# Patient Record
Sex: Female | Born: 1980
Health system: Southern US, Community
[De-identification: ages and names within clinical notes are randomized; demographics above are authoritative.]

## PROBLEM LIST (undated history)

## (undated) DIAGNOSIS — R079 Chest pain, unspecified: Secondary | ICD-10-CM

## (undated) DIAGNOSIS — N920 Excessive and frequent menstruation with regular cycle: Secondary | ICD-10-CM

## (undated) DIAGNOSIS — R5383 Other fatigue: Secondary | ICD-10-CM

## (undated) DIAGNOSIS — M545 Low back pain, unspecified: Secondary | ICD-10-CM

## (undated) DIAGNOSIS — E0590 Thyrotoxicosis, unspecified without thyrotoxic crisis or storm: Secondary | ICD-10-CM

## (undated) DIAGNOSIS — N644 Mastodynia: Secondary | ICD-10-CM

## (undated) DIAGNOSIS — R635 Abnormal weight gain: Secondary | ICD-10-CM

## (undated) DIAGNOSIS — G4733 Obstructive sleep apnea (adult) (pediatric): Secondary | ICD-10-CM

## (undated) DIAGNOSIS — B977 Papillomavirus as the cause of diseases classified elsewhere: Secondary | ICD-10-CM

## (undated) DIAGNOSIS — G479 Sleep disorder, unspecified: Secondary | ICD-10-CM

## (undated) DIAGNOSIS — T7840XA Allergy, unspecified, initial encounter: Secondary | ICD-10-CM

## (undated) DIAGNOSIS — R5381 Other malaise: Secondary | ICD-10-CM

## (undated) DIAGNOSIS — J329 Chronic sinusitis, unspecified: Secondary | ICD-10-CM

## (undated) DIAGNOSIS — E039 Hypothyroidism, unspecified: Secondary | ICD-10-CM

## (undated) DIAGNOSIS — R0789 Other chest pain: Secondary | ICD-10-CM

## (undated) DIAGNOSIS — Z8619 Personal history of other infectious and parasitic diseases: Secondary | ICD-10-CM

## (undated) DIAGNOSIS — K219 Gastro-esophageal reflux disease without esophagitis: Secondary | ICD-10-CM

## (undated) DIAGNOSIS — D649 Anemia, unspecified: Secondary | ICD-10-CM

## (undated) DIAGNOSIS — I319 Disease of pericardium, unspecified: Secondary | ICD-10-CM

## (undated) DIAGNOSIS — J020 Streptococcal pharyngitis: Secondary | ICD-10-CM

## (undated) HISTORY — DX: Disease of pericardium, unspecified: I31.9

## (undated) HISTORY — DX: Excessive and frequent menstruation with regular cycle: N92.0

## (undated) HISTORY — DX: Other fatigue: R53.83

## (undated) HISTORY — DX: Personal history of other infectious and parasitic diseases: Z86.19

## (undated) HISTORY — DX: Hypothyroidism, unspecified: E03.9

## (undated) HISTORY — DX: Low back pain, unspecified: M54.50

## (undated) HISTORY — DX: Other chest pain: R07.89

## (undated) HISTORY — DX: Obstructive sleep apnea (adult) (pediatric): G47.33

## (undated) HISTORY — DX: Papillomavirus as the cause of diseases classified elsewhere: B97.7

## (undated) HISTORY — DX: Chronic sinusitis, unspecified: J32.9

## (undated) HISTORY — DX: Gastro-esophageal reflux disease without esophagitis: K21.9

## (undated) HISTORY — DX: Chest pain, unspecified: R07.9

## (undated) HISTORY — DX: Anemia, unspecified: D64.9

## (undated) HISTORY — DX: Abnormal weight gain: R63.5

## (undated) HISTORY — DX: Sleep disorder, unspecified: G47.9

## (undated) HISTORY — DX: Allergy, unspecified, initial encounter: T78.40XA

## (undated) HISTORY — DX: Other malaise: R53.81

## (undated) HISTORY — DX: Mastodynia: N64.4

## (undated) HISTORY — DX: Morbid (severe) obesity due to excess calories: E66.01

## (undated) HISTORY — DX: Other malaise: R53.83

## (undated) HISTORY — DX: Streptococcal pharyngitis: J02.0

## (undated) HISTORY — DX: Thyrotoxicosis, unspecified without thyrotoxic crisis or storm: E05.90

---

## 2005-01-15 ENCOUNTER — Emergency Department: Payer: Self-pay | Admitting: Unknown Physician Specialty

## 2005-06-14 ENCOUNTER — Observation Stay: Payer: Self-pay

## 2005-08-12 ENCOUNTER — Observation Stay: Payer: Self-pay | Admitting: Unknown Physician Specialty

## 2005-08-14 ENCOUNTER — Inpatient Hospital Stay: Payer: Self-pay | Admitting: Obstetrics & Gynecology

## 2005-08-19 ENCOUNTER — Ambulatory Visit: Payer: Self-pay | Admitting: Pediatrics

## 2006-05-27 IMAGING — US US OB US >=[ID] SNGL FETUS
1 series · 14 of 28 positions shown · non-contrast
Comparison: none

REASON FOR EXAM: AFI, EFW  (Term pregnancy, prior c/section)
COMMENTS:

[Series 1: us ob us >=(id) sngl fetus · 0.33mm/px · 14 of 32 slices shown]
[im 2/32]
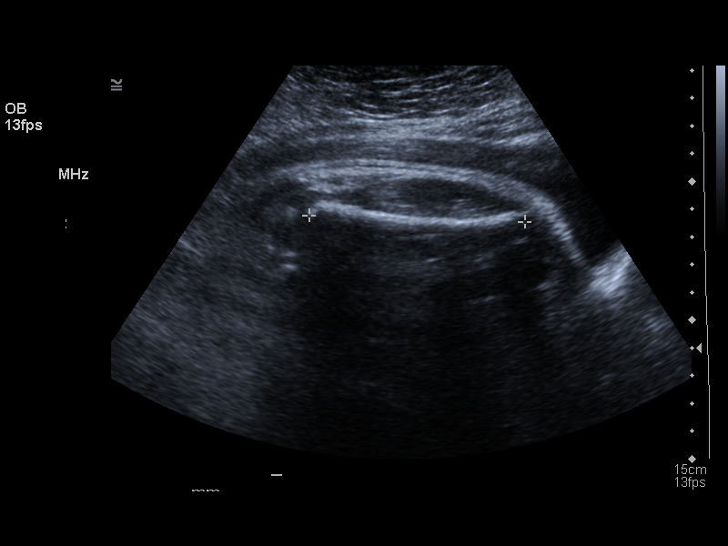
[im 4/32]
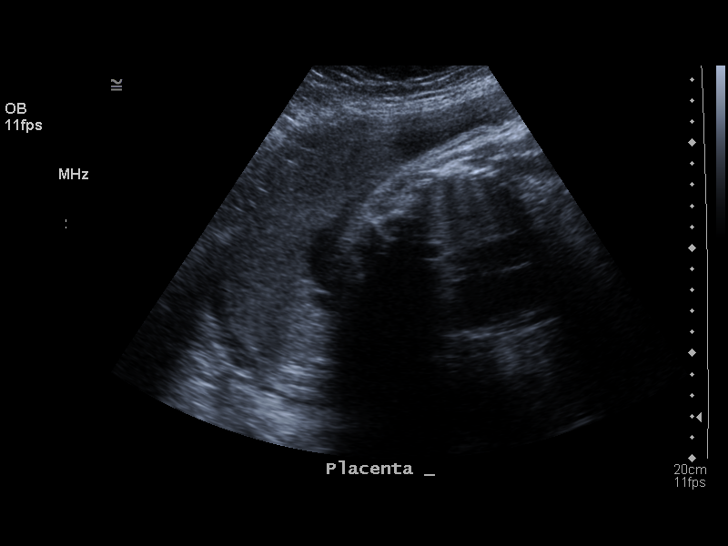
[im 6/32]
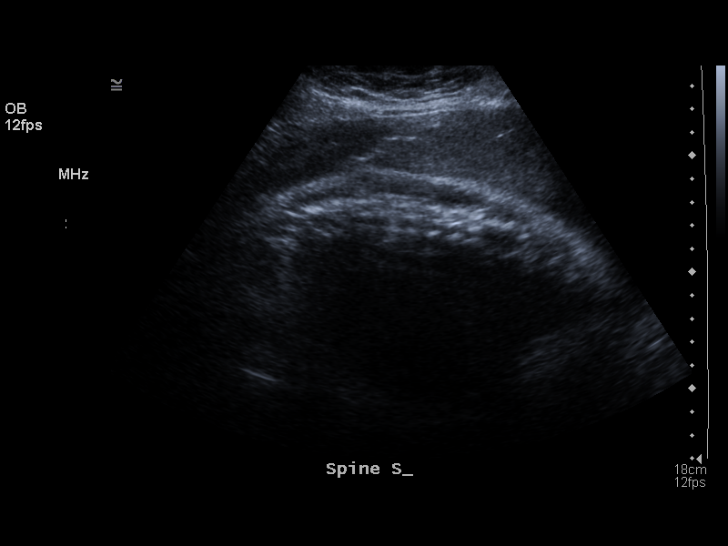
[im 9/32]
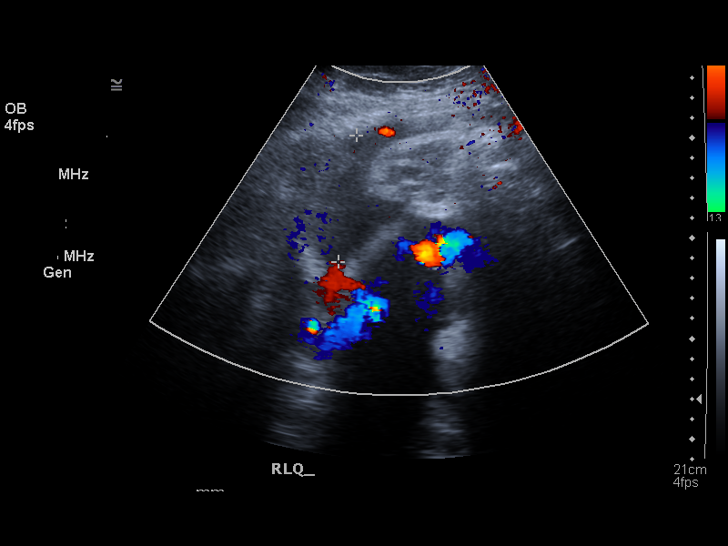
[im 11/32]
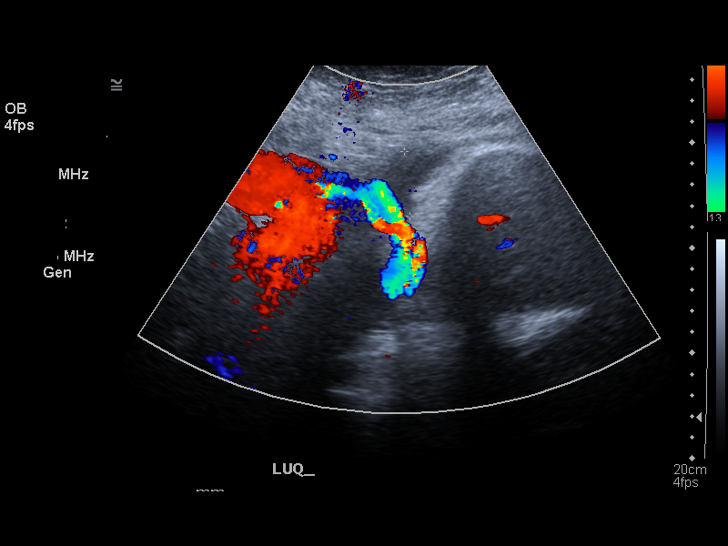
[im 13/32]
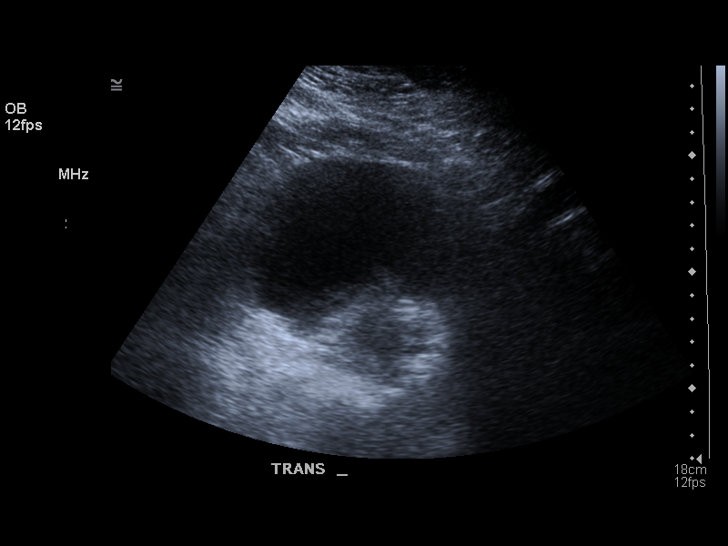
[im 15/32]
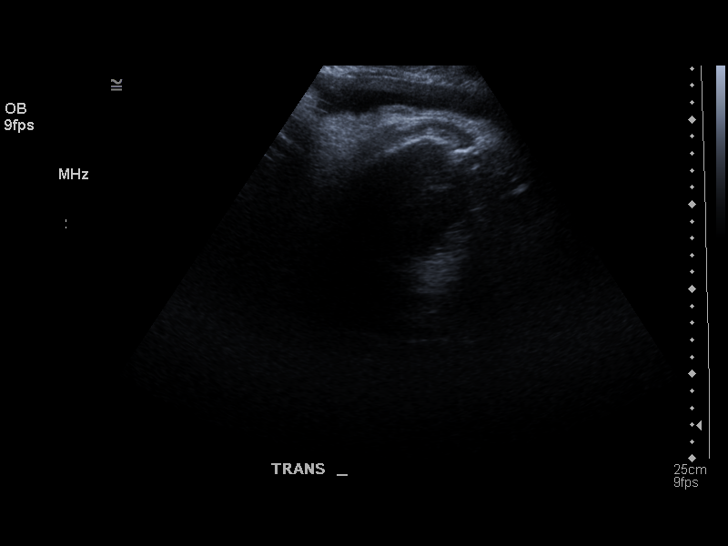
[im 18/32]
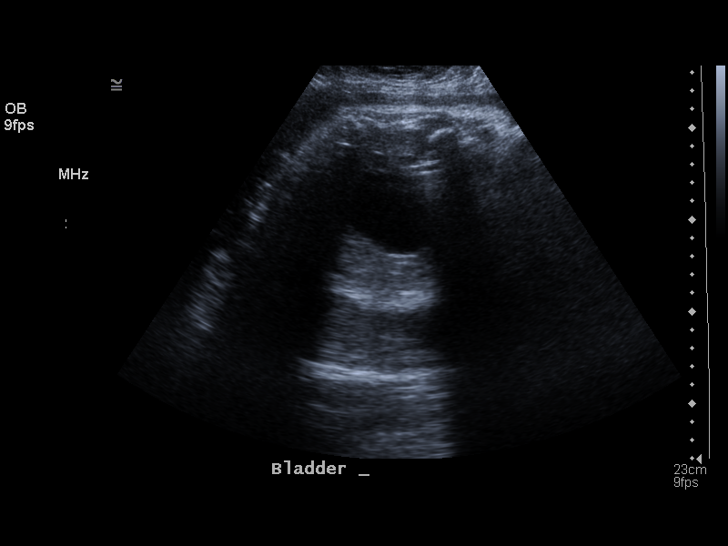
[im 20/32]
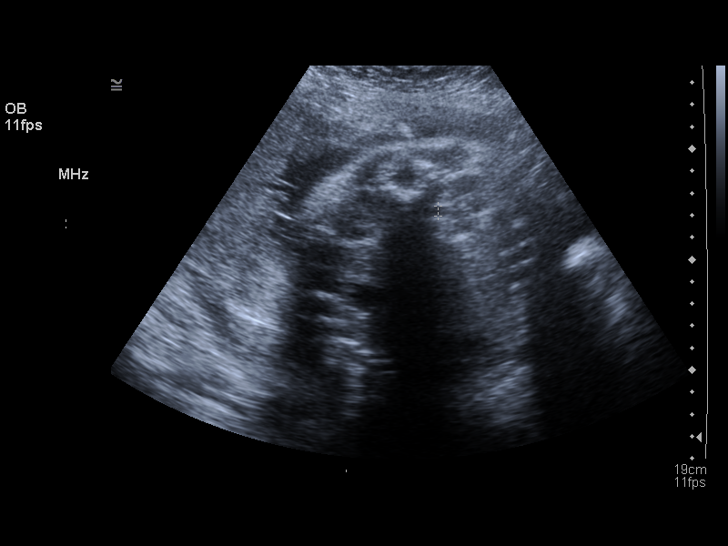
[im 22/32]
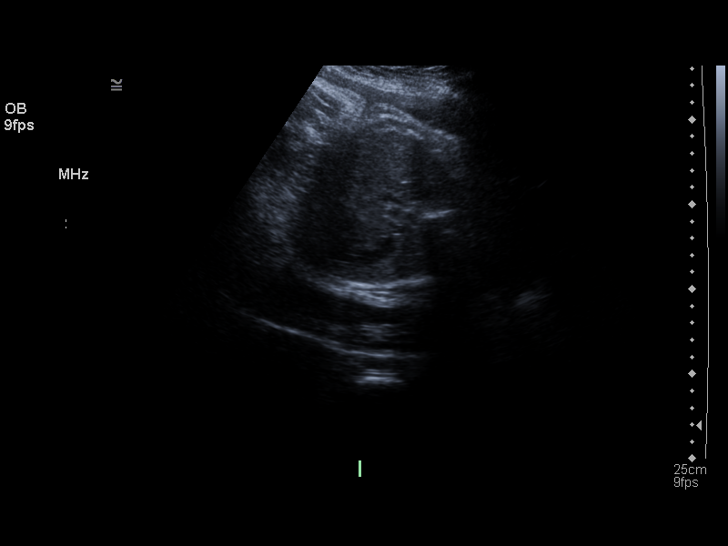
[im 25/32]
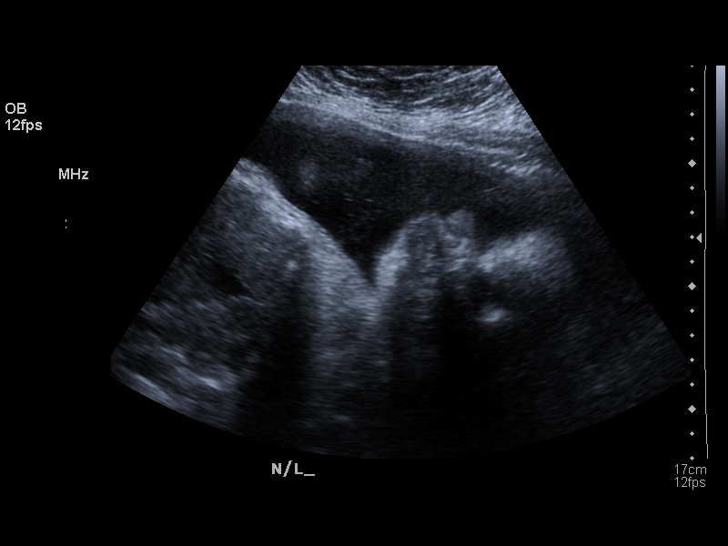
[im 27/32]
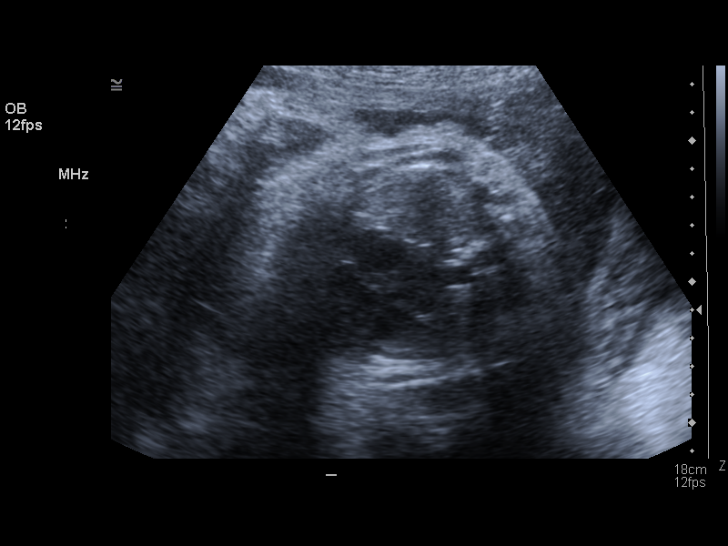
[im 29/32]
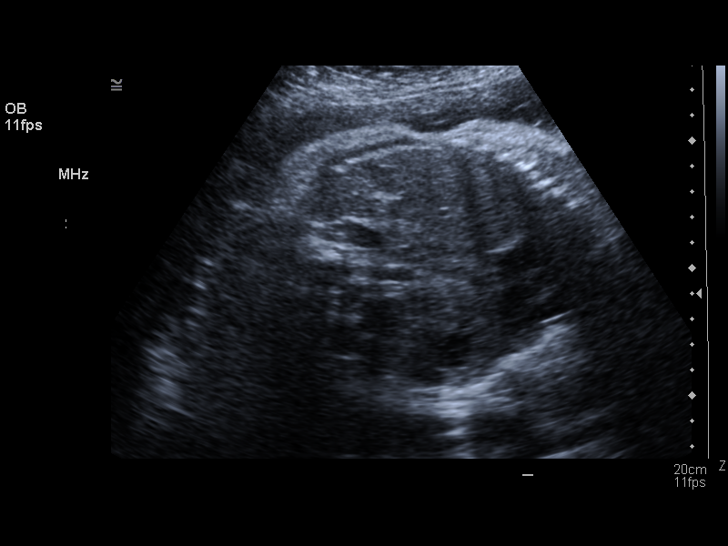
[im 32/32]
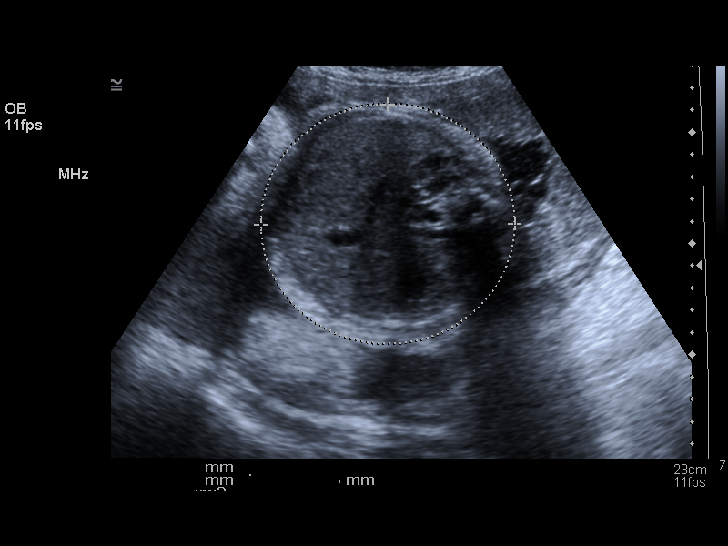

[14 of 28 positions shown; findings below may reference images not displayed]

PROCEDURE:     US  - US PREGNANCY / FETAL AGE  - August 13, 2005  [DATE]

RESULT:     Fetal parts of a single viable intrauterine gestation are
observed.  Presentation currently is cephalic.  Amniotic fluid volume
appears ample.  The AFI measures 19.1 cm which is between the 50th and 95th
percentile.  The fetal heart rate was monitored at 141 beats per minute.
The fetal heart, stomach, and urinary bladder are visualized.  Due to the
advanced stage of the pregnancy, some of the fetal anatomy is difficult to
see.  The fetal spine is suboptimally visualized.  The ventricles are not
seen.

Fetal measurements are as follows:

BPD 96.5 mm (39 weeks 3 days)
HC 341.5 mm (39 weeks 2 days)
AC 349.7 mm (38 weeks 6 days)
FL 77.8 mm (39 weeks 5 days)
EFW 8734 grams.
AFI equal 19.1 cm.
Average ultrasound age 39 weeks 2 days.
Ultrasound EDD 08/18/05.
IMPRESSION: Please see above.

## 2009-12-07 ENCOUNTER — Emergency Department: Payer: Self-pay | Admitting: Emergency Medicine

## 2014-05-03 ENCOUNTER — Encounter: Payer: Self-pay | Admitting: Neurology

## 2014-05-03 ENCOUNTER — Ambulatory Visit (INDEPENDENT_AMBULATORY_CARE_PROVIDER_SITE_OTHER): Payer: 59 | Admitting: Neurology

## 2014-05-03 ENCOUNTER — Encounter (INDEPENDENT_AMBULATORY_CARE_PROVIDER_SITE_OTHER): Payer: Self-pay

## 2014-05-03 VITALS — BP 124/80 | HR 82 | Temp 98.3°F | Ht 63.0 in | Wt 258.0 lb

## 2014-05-03 DIAGNOSIS — G4710 Hypersomnia, unspecified: Secondary | ICD-10-CM

## 2014-05-03 DIAGNOSIS — G479 Sleep disorder, unspecified: Secondary | ICD-10-CM

## 2014-05-03 DIAGNOSIS — R51 Headache: Secondary | ICD-10-CM

## 2014-05-03 DIAGNOSIS — R0683 Snoring: Secondary | ICD-10-CM

## 2014-05-03 DIAGNOSIS — R0989 Other specified symptoms and signs involving the circulatory and respiratory systems: Secondary | ICD-10-CM

## 2014-05-03 DIAGNOSIS — R400 Somnolence: Secondary | ICD-10-CM

## 2014-05-03 DIAGNOSIS — R0609 Other forms of dyspnea: Secondary | ICD-10-CM

## 2014-05-03 DIAGNOSIS — E669 Obesity, unspecified: Secondary | ICD-10-CM

## 2014-05-03 DIAGNOSIS — R519 Headache, unspecified: Secondary | ICD-10-CM

## 2014-05-03 HISTORY — DX: Sleep disorder, unspecified: G47.9

## 2014-05-03 NOTE — Progress Notes (Signed)
Subjective:    Mendez ID: Kathleen Mendez is a 32 y.o. female.  HPI    Star Age, MD, PhD [facility] [address], [address] [place], [place] [address]  Dear Dr. Ardeth Perfect,  I saw your Mendez, Kathleen Mendez, upon your kind request in my neurologic clinic today for initial consultation of her sleep disorder, in particular, concern for underlying obstructive sleep apnea. Kathleen Mendez is unaccompanied today. As you know, Kathleen Mendez is a very friendly 33 year old right-handed woman with an underlying medical history of morbid obesity, who reports snoring and daytime somnolence. Kathleen Mendez was started on Synthroid less than a month ago.   Her typical bedtime is reported to be around 10 to 11 PM and usual wake time is around 5:30 to 6 AM. Sleep onset typically occurs within minutes. Kathleen Mendez reports feeling marginally rested upon awakening. Kathleen Mendez wakes up on an average 1 times in Kathleen middle of Kathleen night and has to go to Kathleen bathroom 0 times on a typical night. Kathleen Mendez admits to occasional morning headaches. Kathleen Mendez prefers to sleep on her left side.  Kathleen Mendez reports excessive daytime somnolence (EDS) and Her Epworth Sleepiness Score (ESS) is 18/24 today. Kathleen Mendez has dozed off while driving, but never had an MVA. Kathleen Mendez has not been taking a scheduled nap, but sleeps in on weekends. Kathleen Mendez does not drink caffeine daily.   Kathleen Mendez has been known to snore for Kathleen past many years. Snoring is reportedly marked, and associated with choking sounds and witnessed apneas. Kathleen Mendez denies a sense of choking or strangling feeling. There is no report of nighttime reflux, with no nighttime cough experienced. Kathleen Mendez has not noted any RLS symptoms and is not known to kick while asleep or before falling asleep. There is no family history of RLS or OSA, but her father snores heavily. Kathleen Mendez denies cataplexy, sleep paralysis, hypnagogic or hypnopompic hallucinations, or sleep attacks. Kathleen Mendez does not report any vivid  dreams, nightmares, dream enactments, or parasomnias, such as sleep talking or sleep walking. Kathleen Mendez has not had a sleep study or a home sleep test. Kathleen Mendez makes sighing noises in sleep.  Kathleen Mendez does not smoke or drink alcohol. Her bedroom is usually dark and cool. There is a TV in Kathleen bedroom and usually it is not on at night.   Her Past Medical History Is Significant For: Past Medical History  Diagnosis Date  . Other malaise and fatigue   . Morbid obesity   . Human papillomavirus in conditions classified elsewhere and of unspecified site   . Abnormal weight gain   . Excessive or frequent menstruation   . Mastodynia     Her Past Surgical History Is Significant For: Past Surgical History  Procedure Laterality Date  . Cesarean section      x2    Her Family History Is Significant For: Family History  Problem Relation Age of Onset  . Cancer Mother     Lung  . Hypertension Father   . Osteoarthritis Father   . Hyperlipidemia Father   . Glaucoma Father   . Lymphoma Father   . Cancer Sister     Lung  . Hypertension Sister   . Seizures Sister     Her Social History Is Significant For: History   Social History  . Marital Status: Single    Spouse Name: N/A    Number of Children: N/A  . Years of Education: N/A   Social History Main Topics  . Smoking  status: Never Smoker   . Smokeless tobacco: None  . Alcohol Use: No  . Drug Use: No  . Sexual Activity: None   Other Topics Concern  . None   Social History Narrative  . None    Her Allergies Are:  No Known Allergies:   Her Current Medications Are:  Outpatient Encounter Prescriptions as of 05/03/2014  Medication Sig  . Ferrous Sulfate (IRON) 325 (65 FE) MG TABS Take 1 tablet by mouth daily.  Marland Kitchen levothyroxine (SYNTHROID, LEVOTHROID) 150 MCG tablet Take 150 mcg by mouth daily before breakfast.  . Multiple Vitamin (MULTIVITAMIN) tablet Take 1 tablet by mouth daily.  :  Review of Systems:  Out of a complete 14 point  review of systems, all are reviewed and negative with Kathleen exception of these symptoms as listed below:   Review of Systems  Constitutional: Positive for fatigue.  Eyes: Negative.   Respiratory: Negative.   Cardiovascular: Negative.   Gastrointestinal: Negative.   Endocrine: Negative.   Genitourinary: Negative.   Musculoskeletal: Negative.   Skin: Negative.   Allergic/Immunologic: Negative.   Neurological: Positive for headaches.  Hematological: Negative.   Psychiatric/Behavioral: Positive for sleep disturbance (snoring, e.d.s.).    Objective:  Neurologic Exam  Physical Exam Physical Examination:   Filed Vitals:   05/03/14 0834  BP: 124/80  Pulse: 82  Temp: 98.3 F (36.8 C)   General Examination: Kathleen Mendez is a very pleasant 33 y.o. female in no acute distress. Kathleen Mendez appears well-developed and well-nourished and well groomed. Kathleen Mendez is overweight.  HEENT: Normocephalic, atraumatic, pupils are equal, round and reactive to light and accommodation. Funduscopic exam is normal with sharp disc margins noted. Extraocular tracking is good without limitation to gaze excursion or nystagmus noted. Normal smooth pursuit is noted. Hearing is grossly intact. Tympanic membranes are clear bilaterally. Face is symmetric with normal facial animation and normal facial sensation. Speech is clear with no dysarthria noted. There is no hypophonia. There is no lip, neck/head, jaw or voice tremor. Neck is supple with full range of passive and active motion. There are no carotid bruits on auscultation. Oropharynx exam reveals: mild mouth dryness, good dental hygiene and mild airway crowding, due to narrow airway entry and tonsils in place but they are small, 1+ bilaterally. Mallampati is class II. Tongue protrudes centrally and palate elevates symmetrically. Neck size is 16.25 inches. Kathleen Mendez has a very mild overbite. Nasal inspection reveals no significant nasal mucosal bogginess or redness and no septal deviation.    Chest: Clear to auscultation without wheezing, rhonchi or crackles noted.  Heart: S1+S2+0, regular and normal without murmurs, rubs or gallops noted.   Abdomen: Soft, non-tender and non-distended with normal bowel sounds appreciated on auscultation.  Extremities: There is no pitting edema in Kathleen distal lower extremities bilaterally. Pedal pulses are intact.  Skin: Warm and dry without trophic changes noted. There are no varicose veins.  Musculoskeletal: exam reveals no obvious joint deformities, tenderness or joint swelling or erythema.   Neurologically:  Mental status: Kathleen Mendez is awake, alert and oriented in all 4 spheres. Her immediate and remote memory, attention, language skills and fund of knowledge are appropriate. There is no evidence of aphasia, agnosia, apraxia or anomia. Speech is clear with normal prosody and enunciation. Thought process is linear. Mood is normal and affect is normal.  Cranial nerves II - XII are as described above under HEENT exam. In addition: shoulder shrug is normal with equal shoulder height noted. Motor exam: Normal bulk, strength and  tone is noted. There is no drift, tremor or rebound. Romberg is negative. Reflexes are 2+ throughout. Babinski: Toes are flexor bilaterally. Fine motor skills and coordination: intact with normal finger taps, normal hand movements, normal rapid alternating patting, normal foot taps and normal foot agility.  Cerebellar testing: No dysmetria or intention tremor on finger to nose testing. Heel to shin is unremarkable bilaterally. There is no truncal or gait ataxia.  Sensory exam: intact to light touch, pinprick, vibration, temperature sense in Kathleen upper and lower extremities.  Gait, station and balance: Kathleen Mendez stands easily. No veering to one side is noted. No leaning to one side is noted. Posture is age-appropriate and stance is narrow based. Gait shows normal stride length and normal pace. No problems turning are noted. Kathleen Mendez turns  en bloc. Tandem walk is unremarkable. Intact toe and heel stance is noted.               Assessment and Plan:   In summary, Ronnette Marsan is a very pleasant 33 y.o.-year old female with a history and physical exam concerning for obstructive sleep apnea (OSA). While Kathleen Mendez does not give a telltale history of witnessed apneas. We have to keep in mind that Kathleen Mendez mostly sleeps alone. Kathleen Mendez reports severe daytime somnolence and has morning headaches. In light of her overweight state and her narrow airway entry, there is risk for underlying OSA. I had a long chat with Kathleen Mendez about my findings and Kathleen diagnosis of OSA, its prognosis and treatment options. We talked about medical treatments, surgical interventions and non-pharmacological approaches. I explained in particular Kathleen risks and ramifications of untreated moderate to severe OSA, especially with respect to developing cardiovascular disease down Kathleen [address], including congestive heart failure, difficult to treat hypertension, cardiac arrhythmias, or stroke. Even type 2 diabetes has, in part, been linked to untreated OSA. Symptoms of untreated OSA include daytime sleepiness, memory problems, mood irritability and mood disorder such as depression and anxiety, lack of energy, as well as recurrent headaches, especially morning headaches. We talked about trying to maintain a healthy lifestyle in general, as well as Kathleen importance of weight control. I encouraged Kathleen Mendez to eat healthy, exercise daily and keep well hydrated, to keep a scheduled bedtime and wake time routine, to not skip any meals and eat healthy snacks in between meals. I advised Kathleen Mendez not to drive when feeling sleepy.  I recommended Kathleen following at this time: sleep study with potential positive airway pressure titration.  I explained Kathleen sleep test procedure to Kathleen Mendez and also outlined possible surgical and non-surgical treatment options of OSA, including Kathleen use of a custom-made dental  device (which would require a referral to a specialist dentist or oral surgeon), upper airway surgical options, such as pillar implants, radiofrequency surgery, tongue base surgery, and UPPP (which would involve a referral to an ENT surgeon). Rarely, jaw surgery such as mandibular advancement may be considered.  I also explained Kathleen CPAP treatment option to Kathleen Mendez, who indicated that Kathleen Mendez would be willing to try CPAP if Kathleen need arises. I explained Kathleen importance of being compliant with PAP treatment, not only for insurance purposes but primarily to improve Her symptoms, and for Kathleen Mendez's long term health benefit, including to reduce Her cardiovascular risks. I answered all her questions today and Kathleen Mendez was in agreement. I would like to see her back after Kathleen sleep study is completed and encouraged her to call with any interim questions, concerns, problems or  updates.   Thank you very much for allowing me to participate in Kathleen care of this nice Mendez. If I can be of any further assistance to you please do not hesitate to call me at [phone].  Sincerely,   Star Age, MD, PhD

## 2014-05-03 NOTE — Patient Instructions (Signed)

## 2014-06-14 ENCOUNTER — Ambulatory Visit (INDEPENDENT_AMBULATORY_CARE_PROVIDER_SITE_OTHER): Payer: 59 | Admitting: Neurology

## 2014-06-14 ENCOUNTER — Encounter: Payer: Self-pay | Admitting: Neurology

## 2014-06-14 DIAGNOSIS — R519 Headache, unspecified: Secondary | ICD-10-CM

## 2014-06-14 DIAGNOSIS — E669 Obesity, unspecified: Secondary | ICD-10-CM

## 2014-06-14 DIAGNOSIS — G4733 Obstructive sleep apnea (adult) (pediatric): Secondary | ICD-10-CM

## 2014-06-14 DIAGNOSIS — R51 Headache: Secondary | ICD-10-CM

## 2014-06-14 DIAGNOSIS — R400 Somnolence: Secondary | ICD-10-CM

## 2014-06-14 DIAGNOSIS — G4710 Hypersomnia, unspecified: Secondary | ICD-10-CM

## 2014-06-14 DIAGNOSIS — G479 Sleep disorder, unspecified: Secondary | ICD-10-CM

## 2014-06-14 DIAGNOSIS — R0683 Snoring: Secondary | ICD-10-CM

## 2014-06-28 ENCOUNTER — Telehealth: Payer: Self-pay | Admitting: Neurology

## 2014-06-28 NOTE — Telephone Encounter (Signed)
Please call and notify the patient that the recent sleep study did confirm the diagnosis of obstructive sleep apnea, moderate and that I recommend treatment for this in the form of CPAP. This will require a repeat sleep study for proper titration and mask fitting. However, since she did not have a good experience with CPAP during her sleep study, please ask her to return for an appointment and set up a FU appt with me so we can discuss options. Thanks, Star Age, MD, PhD [facility])

## 2014-07-13 NOTE — Telephone Encounter (Signed)
I called the patient and left a message briefly explaining her sleep study results and requested a call back to discuss the next course in treatment.

## 2014-07-14 ENCOUNTER — Encounter: Payer: Self-pay | Admitting: Neurology

## 2015-03-02 ENCOUNTER — Other Ambulatory Visit: Payer: Self-pay | Admitting: Cardiovascular Disease

## 2015-03-02 MED ORDER — FLUCONAZOLE 150 MG PO TABS: 150.0000 mg | ORAL_TABLET | Freq: Every day | ORAL | Status: DC

## 2015-03-02 MED ORDER — AZITHROMYCIN 250 MG PO TABS: ORAL_TABLET | ORAL | Status: DC

## 2015-05-30 ENCOUNTER — Ambulatory Visit (INDEPENDENT_AMBULATORY_CARE_PROVIDER_SITE_OTHER): Payer: 59

## 2015-05-30 ENCOUNTER — Ambulatory Visit (INDEPENDENT_AMBULATORY_CARE_PROVIDER_SITE_OTHER): Payer: 59 | Admitting: Emergency Medicine

## 2015-05-30 VITALS — BP 116/74 | HR 65 | Temp 98.1°F | Resp 16 | Ht 64.0 in | Wt 250.0 lb

## 2015-05-30 DIAGNOSIS — R350 Frequency of micturition: Secondary | ICD-10-CM

## 2015-05-30 DIAGNOSIS — M79672 Pain in left foot: Secondary | ICD-10-CM

## 2015-05-30 LAB — POCT URINALYSIS DIPSTICK
Bilirubin, UA: NEGATIVE
Blood, UA: NEGATIVE
Glucose, UA: NEGATIVE
Ketones, UA: NEGATIVE
Leukocytes, UA: NEGATIVE
Nitrite, UA: NEGATIVE
Protein, UA: NEGATIVE
Spec Grav, UA: 1.02
Urobilinogen, UA: 0.2
pH, UA: 6

## 2015-05-30 LAB — POCT UA - MICROSCOPIC ONLY
Bacteria, U Microscopic: NEGATIVE
Casts, Ur, LPF, POC: NEGATIVE
Crystals, Ur, HPF, POC: NEGATIVE
Mucus, UA: NEGATIVE
RBC, urine, microscopic: NEGATIVE
Yeast, UA: NEGATIVE

## 2015-05-30 LAB — POCT GLYCOSYLATED HEMOGLOBIN (HGB A1C): Hemoglobin A1C: 5.7

## 2015-05-30 MED ORDER — NAPROXEN 500 MG PO TABS: 500.0000 mg | ORAL_TABLET | Freq: Two times a day (BID) | ORAL | Status: DC

## 2015-05-30 NOTE — Progress Notes (Signed)
  Medical screening examination/treatment/procedure(s) were performed by non-physician practitioner and as supervising physician I was immediately available for consultation/collaboration.     

## 2015-05-30 NOTE — Progress Notes (Signed)
05/30/2015 at 5:20 PM  Kathleen Mendez / DOB: January 10, 1981 / MRN: [id number]  The patient has Sleep disturbance on her problem list.  SUBJECTIVE  Chief complaint: Foot Pain and Urinary Tract Infection  Kathleen Mendez is a 34 y.o. female here today for left sided lateral plantar heal pain that started 3 weeks ago. Reports that the pain is sharp, severe at times and is made worse by walking.  She denies any trauma to the foot and a history of diabetes. She denies ankle pain.   Patient reports some urinary frequency, mostly when lying down to go to bed.  Denies urinary urgency, dysuria and hematuria.  She denies any vaginal symptoms.  She declines a pelvic examination today.   She  has a past medical history of Other malaise and fatigue; Morbid obesity; Human papillomavirus in conditions classified elsewhere and of unspecified site; Abnormal weight gain; Excessive or frequent menstruation; Mastodynia; Sleep disturbance (05/03/2014); Allergy; and Anemia.    Medications reviewed and updated by myself where necessary, and exist elsewhere in the encounter.   Kathleen Mendez has No Known Allergies. She  reports that she has never smoked. She does not have any smokeless tobacco history on file. She reports that she does not drink alcohol or use illicit drugs. She  has no sexual activity history on file. The patient  has past surgical history that includes Cesarean section.  Her family history includes Cancer in her mother and sister; Glaucoma in her father; Hyperlipidemia in her father; Hypertension in her father and sister; Lymphoma in her father; Osteoarthritis in her father; Seizures in her sister.  Review of Systems  Constitutional: Negative for fever and chills.  Respiratory: Negative for cough.   Cardiovascular: Negative for chest pain.  Genitourinary: Positive for frequency. Negative for dysuria, urgency, hematuria and flank pain.  Skin: Negative for itching and rash.  Neurological: Negative for  headaches.  Endo/Heme/Allergies: Negative for polydipsia.    OBJECTIVE  Her  height is 5\' 4"  (1.626 m) and weight is 250 lb (113.399 kg). Her oral temperature is 98.1 F (36.7 C). Her blood pressure is 116/74 and her pulse is 65. Her respiration is 16 and oxygen saturation is 84%.  The patient's body mass index is 42.89 kg/(m^2).  Physical Exam  Constitutional: She is oriented to person, place, and time. She appears well-developed and well-nourished. No distress.  Cardiovascular: Normal rate.   Pulses:      Dorsalis pedis pulses are 2+ on the right side, and 2+ on the left side.       Posterior tibial pulses are 2+ on the right side, and 2+ on the left side.  Respiratory: Effort normal.  GI: Soft. She exhibits no distension and no mass. There is no tenderness. There is no rebound and no guarding.  Musculoskeletal:       Feet:  Neurological: She is alert and oriented to person, place, and time.  Skin: Skin is warm and dry. She is not diaphoretic.  Psychiatric: She has a normal mood and affect.    Results for orders placed or performed in visit on 05/30/15 (from the past 24 hour(s))  POCT urinalysis dipstick     Status: None   Collection Time: 05/30/15  4:21 PM  Result Value Ref Range   Color, UA yellow    Clarity, UA clear    Glucose, UA neg    Bilirubin, UA neg    Ketones, UA neg    Spec Grav, UA 1.020  Blood, UA neg    pH, UA 6.0    Protein, UA neg    Urobilinogen, UA 0.2    Nitrite, UA neg    Leukocytes, UA Negative Negative  POCT UA - Microscopic Only     Status: None   Collection Time: 05/30/15  4:21 PM  Result Value Ref Range   WBC, Ur, HPF, POC 0-1    RBC, urine, microscopic neg    Bacteria, U Microscopic neg    Mucus, UA neg    Epithelial cells, urine per micros 0-1    Crystals, Ur, HPF, POC neg    Casts, Ur, LPF, POC neg    Yeast, UA neg    [facility] reading (PRIMARY) by  Dr. Ouida Sills: Negative for osseous abnormality.   ASSESSMENT & PLAN  Kathleen Mendez was seen  today for foot pain and urinary tract infection.  Diagnoses and all orders for this visit:  Urinary frequency: Patient with normal urine.  I doubt her symptoms are infectious in nature however will culture urine.  Patient does not want a wet prep here.  She has agreed to call her GYN doctor for further work up of this problem.  Orders: -     POCT urinalysis dipstick -     POCT UA - Microscopic Only -     POCT glycosylated hemoglobin (Hb A1C)  Left foot pain Orders: -     DG Foot Complete Left; Future -     AMB podiatry -     Naprosyn 500 mg bid with food.   Morbid Obesity: Most likely causing problem 2.      The patient was advised to call or come back to clinic if she does not see an improvement in symptoms, or worsens with the above plan.   Philis Fendt, MHS, PA-C [facility] 05/30/2015 5:20 PM

## 2015-06-01 LAB — URINE CULTURE: Colony Count: 15000

## 2015-07-27 ENCOUNTER — Ambulatory Visit (INDEPENDENT_AMBULATORY_CARE_PROVIDER_SITE_OTHER): Payer: 59

## 2015-07-27 ENCOUNTER — Ambulatory Visit (INDEPENDENT_AMBULATORY_CARE_PROVIDER_SITE_OTHER): Payer: 59 | Admitting: Podiatry

## 2015-07-27 VITALS — BP 136/86 | HR 81 | Resp 16 | Ht 63.0 in | Wt 243.0 lb

## 2015-07-27 DIAGNOSIS — M79672 Pain in left foot: Secondary | ICD-10-CM

## 2015-07-27 DIAGNOSIS — M722 Plantar fascial fibromatosis: Secondary | ICD-10-CM

## 2015-07-27 MED ORDER — TRIAMCINOLONE ACETONIDE 10 MG/ML IJ SUSP
10.0000 mg | Freq: Once | INTRAMUSCULAR | Status: AC
  Administered 2015-07-27: 10 mg

## 2015-07-27 MED ORDER — DICLOFENAC SODIUM 75 MG PO TBEC: 75.0000 mg | DELAYED_RELEASE_TABLET | Freq: Two times a day (BID) | ORAL | Status: DC

## 2015-07-27 NOTE — Patient Instructions (Signed)

## 2015-07-27 NOTE — Progress Notes (Signed)
   Subjective:    Patient ID: Kathleen Mendez, female    DOB: [birth year], 34 y.o.   MRN: [id number]  HPI Patient presents with foot pain in their left foot, heel-towards lateral side of foot. Left foot, heel. This has been going on for the past  5 months.   Review of Systems  All other systems reviewed and are negative.      Objective:   Physical Exam        Assessment & Plan:

## 2015-07-29 NOTE — Progress Notes (Signed)
Subjective:     Patient ID: Kathleen Mendez, female   DOB: July 15, 1981, 34 y.o.   MRN: [id number]  HPI patient presents with pain in the plantar center and lateral side of the left heel at the insertional point tendon the calcaneus that's been present for at least 5 months. States it's gradually been getting worse over that time   Review of Systems  All other systems reviewed and are negative.      Objective:   Physical Exam  Constitutional: She is oriented to person, place, and time.  Cardiovascular: Intact distal pulses.   Musculoskeletal: Normal range of motion.  Neurological: She is oriented to person, place, and time.  Skin: Skin is warm.  Nursing note and vitals reviewed.  neurovascular status intact muscle strength adequate range of motion within normal limits with patient noted to have moderate depression of the arch and noted to have mild equinus condition. She has discomfort in the plantar center and lateral portion of the plantar fascia near its insertion into the calcaneus with fluid buildup noted     Assessment:     Acute plantar fasciitis center and lateral plantar fascial left    Plan:     H&P x-rays reviewed with patient and discussed problems. Today careful injection administered for medial side 3 mg Kenalog 5 mg Xylocaine and fascial brace administered along with prescription for anti-inflammatory. Gave instructions on physical therapy shoe gear modification and reappoint to recheck in 2 weeks

## 2015-08-20 ENCOUNTER — Encounter: Payer: Self-pay | Admitting: Podiatry

## 2015-08-20 ENCOUNTER — Ambulatory Visit (INDEPENDENT_AMBULATORY_CARE_PROVIDER_SITE_OTHER): Payer: 59 | Admitting: Podiatry

## 2015-08-20 VITALS — BP 131/71 | HR 69 | Resp 16

## 2015-08-20 DIAGNOSIS — M722 Plantar fascial fibromatosis: Secondary | ICD-10-CM | POA: Diagnosis not present

## 2015-08-23 NOTE — Progress Notes (Signed)
Subjective:     Patient ID: Kathleen Mendez, female   DOB: Nov 11, 1980, 34 y.o.   MRN: [id number]  HPI patient states I'm doing quite a bit better with my pain with a significant reduction of discomfort upon walking and standing   Review of Systems     Objective:   Physical Exam  neurovascular status intact muscle strength adequate with significant diminishment of discomfort in the plantar heel upon palpation with moderate depression of the arch still noted    Assessment:      plantar fascial symptomatology which is improving but still present    Plan:      H&P conditions reviewed and advised on physical therapy supportive shoe gear usage anti-inflammatories and patient will be seen back for [place] to recheck again if symptoms persist.

## 2015-09-11 ENCOUNTER — Ambulatory Visit: Payer: 59 | Admitting: Podiatry

## 2015-11-26 DIAGNOSIS — J020 Streptococcal pharyngitis: Secondary | ICD-10-CM | POA: Diagnosis not present

## 2015-11-26 DIAGNOSIS — E038 Other specified hypothyroidism: Secondary | ICD-10-CM | POA: Diagnosis not present

## 2015-11-26 DIAGNOSIS — J029 Acute pharyngitis, unspecified: Secondary | ICD-10-CM | POA: Diagnosis not present

## 2015-11-26 DIAGNOSIS — Z6841 Body Mass Index (BMI) 40.0 and over, adult: Secondary | ICD-10-CM | POA: Diagnosis not present

## 2016-03-12 IMAGING — CR DG FOOT COMPLETE 3+V*L*
3 series · 3 of 3 positions shown · non-contrast
Comparison: None

CLINICAL DATA: LEFT lateral plantar heel pain beginning 3 weeks
ago, sharp and severe at times worse with walking, no known trauma

EXAM:
LEFT FOOT - COMPLETE 3+ VIEW

[AP]
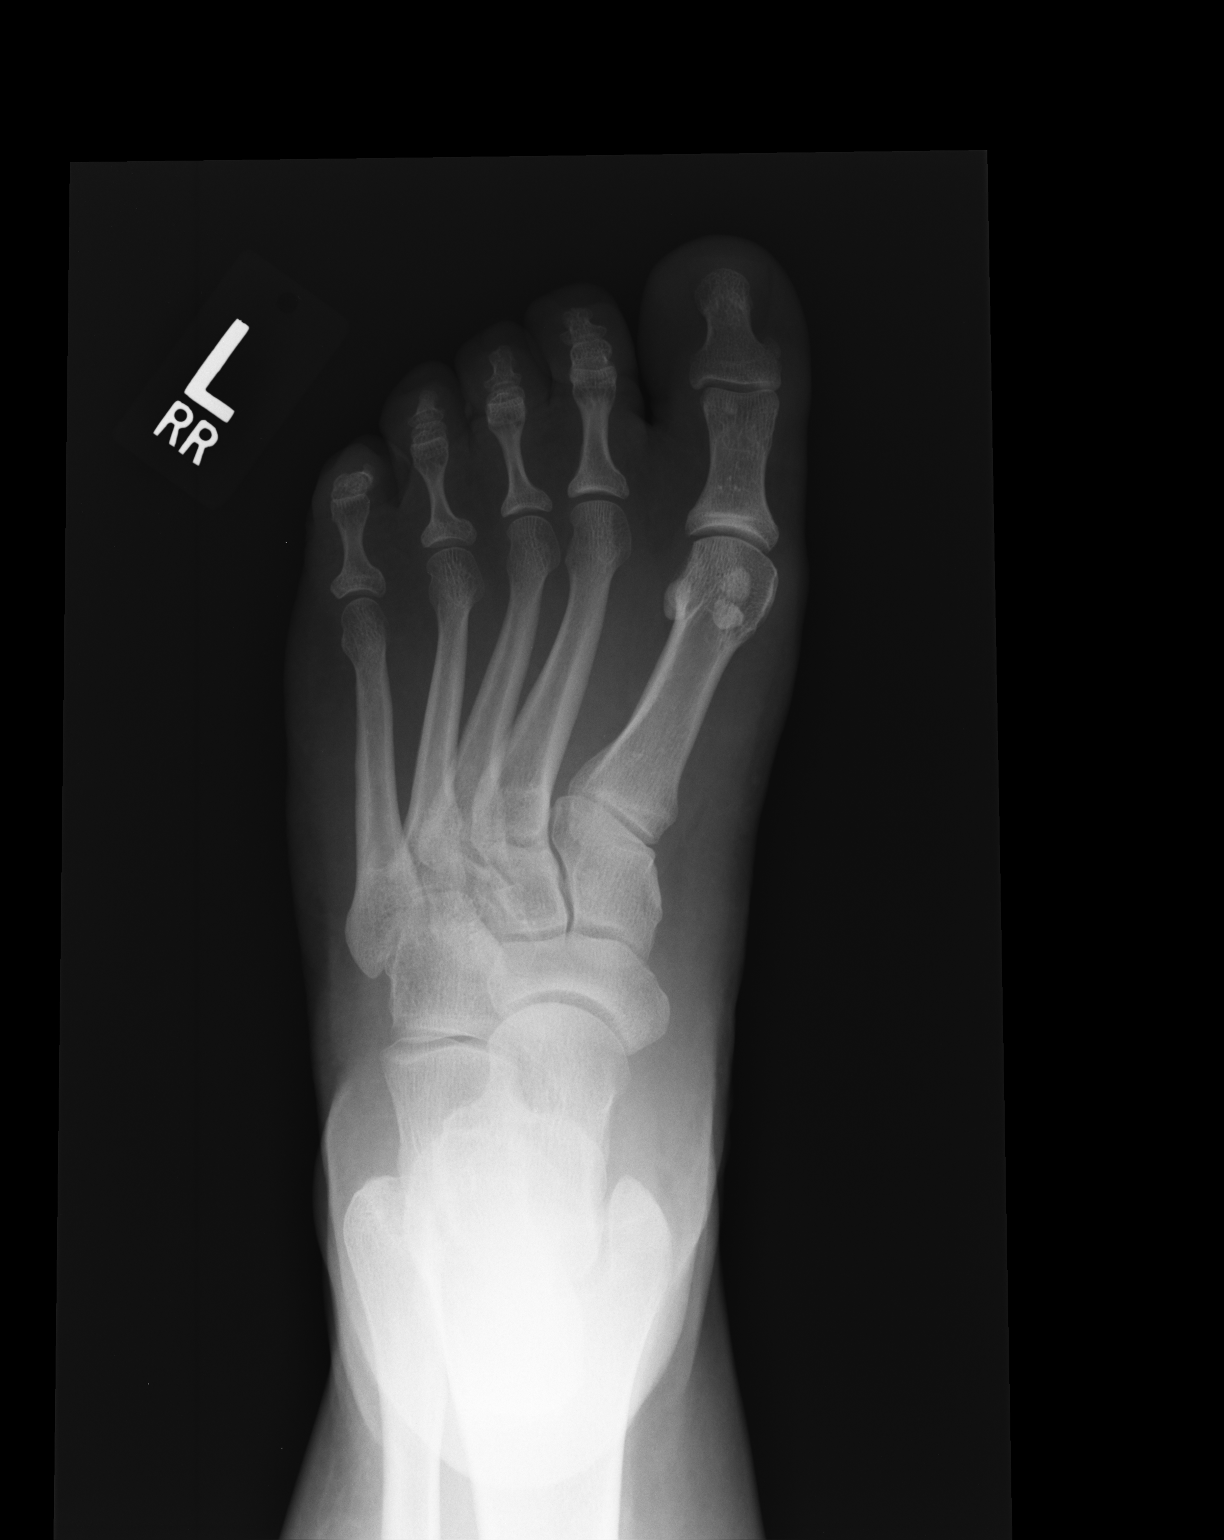

[ap obl int rot]
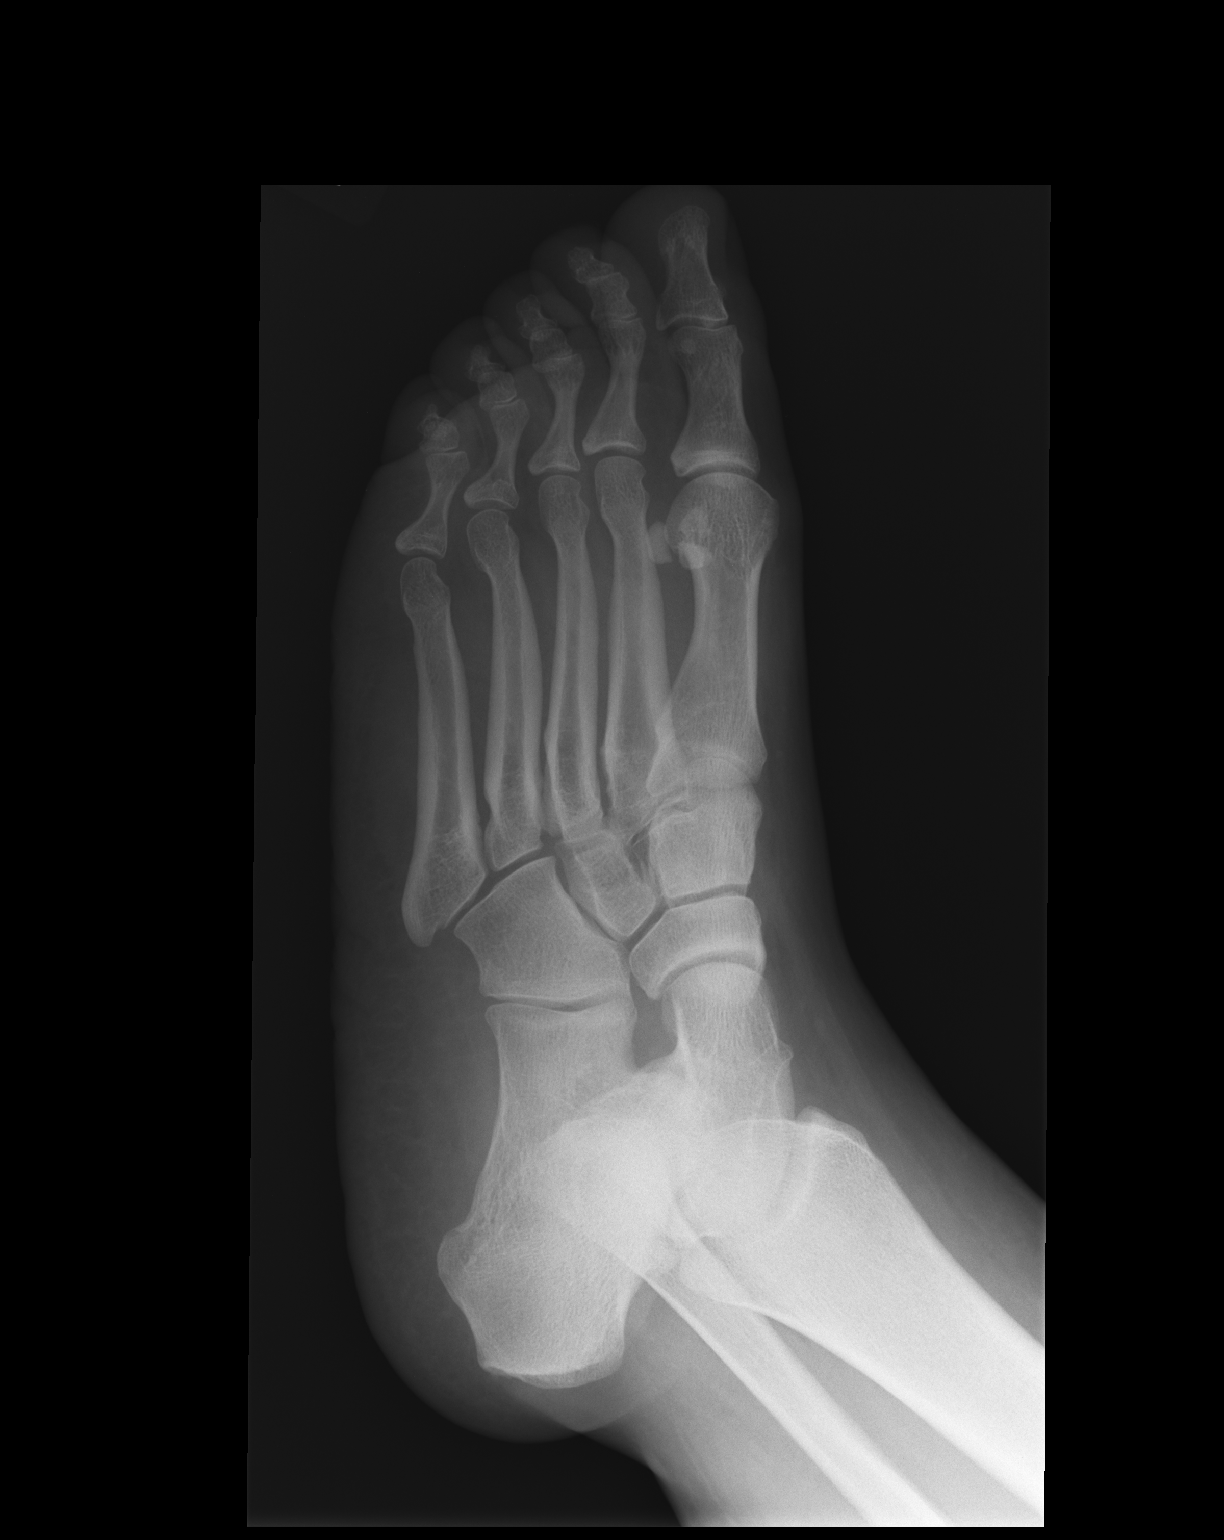

[lateral]
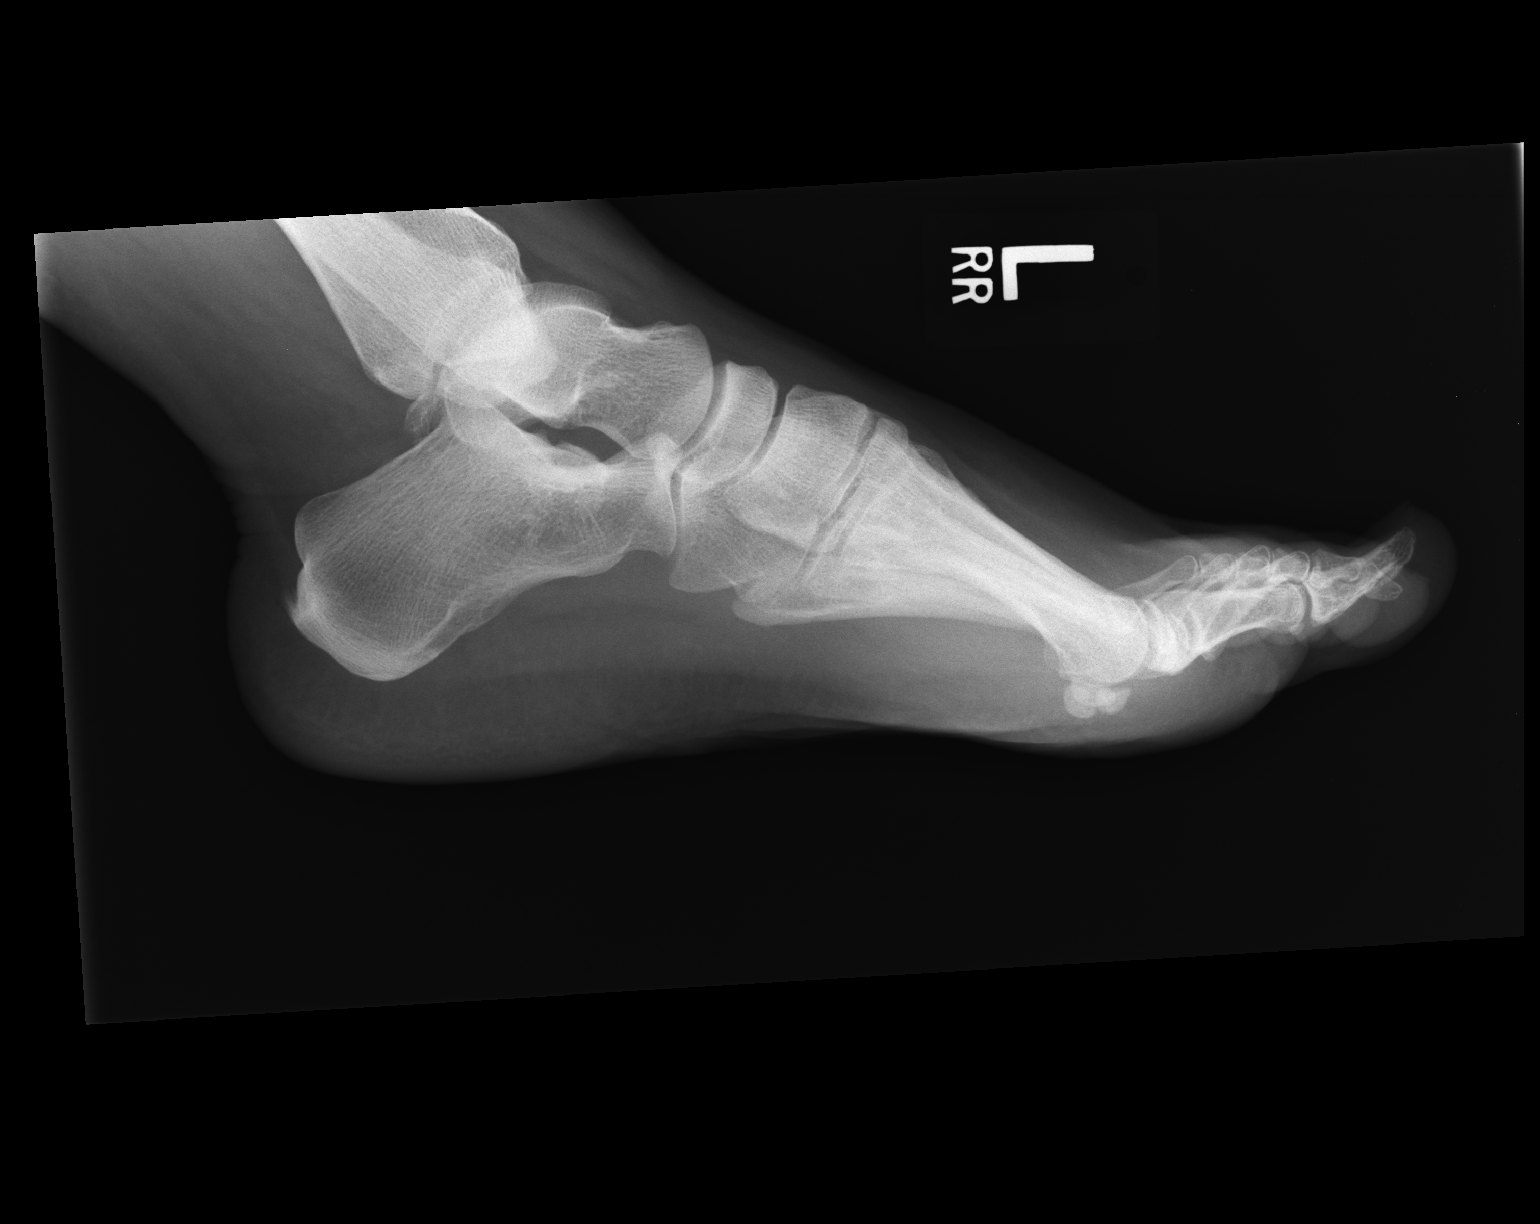

[3 of 3 positions shown; findings below may reference images not displayed]

FINDINGS: Osseous mineralization normal.

Joint spaces preserved.

No fracture, dislocation, or bone destruction.
IMPRESSION: Normal exam.

## 2016-05-21 ENCOUNTER — Encounter: Payer: Self-pay | Admitting: Podiatry

## 2016-05-21 ENCOUNTER — Ambulatory Visit (INDEPENDENT_AMBULATORY_CARE_PROVIDER_SITE_OTHER): Payer: 59 | Admitting: Podiatry

## 2016-05-21 VITALS — BP 125/72 | HR 72 | Resp 16

## 2016-05-21 DIAGNOSIS — M722 Plantar fascial fibromatosis: Secondary | ICD-10-CM

## 2016-05-21 DIAGNOSIS — L821 Other seborrheic keratosis: Secondary | ICD-10-CM | POA: Diagnosis not present

## 2016-05-21 DIAGNOSIS — L918 Other hypertrophic disorders of the skin: Secondary | ICD-10-CM | POA: Diagnosis not present

## 2016-05-21 DIAGNOSIS — D225 Melanocytic nevi of trunk: Secondary | ICD-10-CM | POA: Diagnosis not present

## 2016-05-21 DIAGNOSIS — D229 Melanocytic nevi, unspecified: Secondary | ICD-10-CM | POA: Diagnosis not present

## 2016-05-21 DIAGNOSIS — E038 Other specified hypothyroidism: Secondary | ICD-10-CM | POA: Diagnosis not present

## 2016-05-21 DIAGNOSIS — D485 Neoplasm of uncertain behavior of skin: Secondary | ICD-10-CM | POA: Diagnosis not present

## 2016-05-21 MED ORDER — TRIAMCINOLONE ACETONIDE 10 MG/ML IJ SUSP
10.0000 mg | Freq: Once | INTRAMUSCULAR | Status: AC
  Administered 2016-05-21: 10 mg

## 2016-05-21 NOTE — Progress Notes (Signed)
Subjective:     Patient ID: Kathleen Mendez, female   DOB: 11-18-1980, 35 y.o.   MRN: [id number]  HPI patient presents stating I'm getting pain in my heel again left it's on the center and outside   Review of Systems     Objective:   Physical Exam Neurovascular status intact muscle strength adequate with pain on the center and lateral side of the left heel that had been present for year and got better for about 6 months with reoccurrence    Assessment:     Plantar fasciitis center lateral left with pain    Plan:     Advised on physical therapy and at this time we may make orthotics but we'll hold off and I injected the lateral and central plantar fascia with 3 mg Kenalog 5 mg Xylocaine

## 2016-05-28 DIAGNOSIS — Z01419 Encounter for gynecological examination (general) (routine) without abnormal findings: Secondary | ICD-10-CM | POA: Diagnosis not present

## 2017-05-12 ENCOUNTER — Encounter: Payer: Self-pay | Admitting: Obstetrics & Gynecology

## 2017-05-12 ENCOUNTER — Ambulatory Visit (INDEPENDENT_AMBULATORY_CARE_PROVIDER_SITE_OTHER): Payer: 59 | Admitting: Obstetrics & Gynecology

## 2017-05-12 VITALS — BP 138/80 | HR 78 | Ht 63.0 in | Wt 242.0 lb

## 2017-05-12 DIAGNOSIS — E039 Hypothyroidism, unspecified: Secondary | ICD-10-CM | POA: Diagnosis not present

## 2017-05-12 DIAGNOSIS — Z124 Encounter for screening for malignant neoplasm of cervix: Secondary | ICD-10-CM | POA: Diagnosis not present

## 2017-05-12 DIAGNOSIS — Z3041 Encounter for surveillance of contraceptive pills: Secondary | ICD-10-CM | POA: Diagnosis not present

## 2017-05-12 DIAGNOSIS — Z0000 Encounter for general adult medical examination without abnormal findings: Secondary | ICD-10-CM | POA: Diagnosis not present

## 2017-05-12 MED ORDER — PHENTERMINE HCL 37.5 MG PO TABS: 37.5000 mg | ORAL_TABLET | Freq: Every day | ORAL | 0 refills | Status: DC

## 2017-05-12 MED ORDER — PHENTERMINE HCL 37.5 MG PO TABS: 37.5000 mg | ORAL_TABLET | Freq: Every day | ORAL | 1 refills | Status: DC

## 2017-05-12 MED ORDER — CAMRESE 0.15-0.03 &0.01 MG PO TABS: 1.0000 | ORAL_TABLET | Freq: Every day | ORAL | 3 refills | Status: DC

## 2017-05-12 MED FILL — PHENTERMINE 37.5 MG TABLET: 37.5 | 30 days supply | Qty: 30 | Fill #0 | Status: TO

## 2017-05-12 NOTE — Progress Notes (Signed)
HPI:      [name] is a 36 y.o. [id number] who LMP was No LMP recorded., she presents today for her annual examination. The patient has no complaints today. The patient is not currently sexually active. Her last pap: approximate date 2015 and was normal. The patient does perform self breast exams.  There is no notable family history of breast or ovarian cancer in her family.  The patient has regular exercise: yes.  The patient denies current symptoms of depression.    GYN History: Contraception: OCP (estrogen/progesterone)  PMHx: Past Medical History:  Diagnosis Date  . Abnormal weight gain   . Allergy   . Anemia   . Excessive or frequent menstruation   . Human papillomavirus in conditions classified elsewhere and of unspecified site   . Mastodynia   . Morbid obesity ([place])   . Other malaise and fatigue   . Sleep disturbance 05/03/2014   Past Surgical History:  Procedure Laterality Date  . CESAREAN SECTION     x2   Family History  Problem Relation Age of Onset  . Cancer Mother        Lung  . Hypertension Father   . Osteoarthritis Father   . Hyperlipidemia Father   . Glaucoma Father   . Lymphoma Father   . Cancer Sister        Lung  . Hypertension Sister   . Seizures Sister    Social History  Substance Use Topics  . Smoking status: Never Smoker  . Smokeless tobacco: Never Used  . Alcohol use No    Current Outpatient Prescriptions:  .  Biotin 10000 MCG TABS, Take 1 tablet by mouth daily., Disp: , Rfl:  .  CAMRESE 0.15-0.03 &0.01 MG tablet, Take 1 tablet by mouth daily., Disp: 1 Package, Rfl: 3 .  diclofenac (VOLTAREN) 75 MG EC tablet, Take 1 tablet (75 mg total) by mouth 2 (two) times daily., Disp: 50 tablet, Rfl: 2 .  phentermine (ADIPEX-P) 37.5 MG tablet, Take 1 tablet (37.5 mg total) by mouth daily before breakfast., Disp: 30 tablet, Rfl: 0 Allergies: Patient has no known allergies.  Review of Systems  Constitutional: Negative for chills, fever and  malaise/fatigue.  HENT: Negative for congestion, sinus pain and sore throat.   Eyes: Negative for blurred vision and pain.  Respiratory: Negative for cough and wheezing.   Cardiovascular: Negative for chest pain and leg swelling.  Gastrointestinal: Negative for abdominal pain, constipation, diarrhea, heartburn, nausea and vomiting.  Genitourinary: Negative for dysuria, frequency, hematuria and urgency.  Musculoskeletal: Negative for back pain, joint pain, myalgias and neck pain.  Skin: Negative for itching and rash.  Neurological: Negative for dizziness, tremors and weakness.  Endo/Heme/Allergies: Does not bruise/bleed easily.  Psychiatric/Behavioral: Negative for depression. The patient is not nervous/anxious and does not have insomnia.     Objective: BP 138/80   Pulse 78   Ht 5\' 3"  (1.6 m)   Wt 242 lb (109.8 kg)   BMI 42.87 kg/m   Filed Weights   05/12/17 0806  Weight: 242 lb (109.8 kg)   Body mass index is 42.87 kg/m. Physical Exam  Constitutional: She is oriented to person, place, and time. She appears well-developed and well-nourished. No distress.  Genitourinary: Rectum normal, vagina normal and uterus normal. Pelvic exam was performed with patient supine. There is no rash or lesion on the right labia. There is no rash or lesion on the left labia. Vagina exhibits no lesion. No bleeding in the  vagina. Right adnexum does not display mass and does not display tenderness. Left adnexum does not display mass and does not display tenderness. Cervix does not exhibit motion tenderness, lesion, friability or polyp.   Uterus is mobile and midaxial. Uterus is not enlarged or exhibiting a mass.  HENT:  Head: Normocephalic and atraumatic. Head is without laceration.  Right Ear: Hearing normal.  Left Ear: Hearing normal.  Nose: No epistaxis.  No foreign bodies.  Mouth/Throat: Uvula is midline, oropharynx is clear and moist and mucous membranes are normal.  Eyes: Pupils are equal, round,  and reactive to light.  Neck: Normal range of motion. Neck supple. No thyromegaly present.  Cardiovascular: Normal rate and regular rhythm.  Exam reveals no gallop and no friction rub.   No murmur heard. Pulmonary/Chest: Effort normal and breath sounds normal. No respiratory distress. She has no wheezes. Right breast exhibits no mass, no skin change and no tenderness. Left breast exhibits no mass, no skin change and no tenderness.  Abdominal: Soft. Bowel sounds are normal. She exhibits no distension. There is no tenderness. There is no rebound.  Musculoskeletal: Normal range of motion.  Neurological: She is alert and oriented to person, place, and time. No cranial nerve deficit.  Skin: Skin is warm and dry.  Psychiatric: She has a normal mood and affect. Judgment normal.  Vitals reviewed.   Assessment:  ANNUAL EXAM 1. Annual physical exam   2. Morbid obesity (HCC) Chronic  3. Screening for cervical cancer   4. Encounter for surveillance of contraceptive pills      Screening Plan:            1.  Cervical Screening-  Pap smear done today  2. Labs Ordered today  3. Counseling for contraception: oral contraceptives (estrogen/progesterone)  Other:  1. Morbid obesity ([place]) - options discussed; meds also discussed - phentermine (ADIPEX-P) 37.5 MG tablet; Take 1 tablet (37.5 mg total) by mouth daily before breakfast.  Dispense: 30 tablet; Refill: 0 - TSH  2. Annual physical exam  3. Screening for cervical cancer - IGP, Aptima HPV  4. Encounter for surveillance of contraceptive pills - CAMRESE 0.15-0.03 &0.01 MG tablet; Take 1 tablet by mouth daily.  Dispense: 1 Package; Refill: 3 - I discussed multiple birth control options and methods with the patient.  The risks and benefits of each were reviewed.  The possible side effects including deep venous thrombosis, breast tenderness, fluid retention, mood changes and abnormal vaginal bleeding were discussed.  Combination as well as  progesterone-only options, pros and cons counseled.    F/U  Return in about 4 weeks (around 06/09/2017) for Follow up.  Barnett Applebaum, MD, [facility], [facility] 05/12/2017  8:35 AM

## 2017-05-12 NOTE — Addendum Note (Signed)
Addended by: Gae Dry on: 05/12/2017 08:53 AM   Modules accepted: Orders

## 2017-05-12 NOTE — Patient Instructions (Signed)

## 2017-05-13 LAB — TSH: TSH: 4.94 u[IU]/mL — ABNORMAL HIGH (ref 0.450–4.500)

## 2017-05-14 ENCOUNTER — Encounter: Payer: Self-pay | Admitting: Obstetrics & Gynecology

## 2017-05-14 MED ORDER — LEVOTHYROXINE SODIUM 125 MCG PO TABS: 125.0000 ug | ORAL_TABLET | Freq: Every day | ORAL | 11 refills | Status: DC

## 2017-05-14 MED FILL — LEVOTHYROXINE 125 MCG TABLE: 125 | 30 days supply | Qty: 30 | Fill #0 | Status: TO

## 2017-05-14 NOTE — Addendum Note (Signed)
Addended by: Gae Dry on: 05/14/2017 12:02 PM   Modules accepted: Orders

## 2017-05-15 LAB — IGP, APTIMA HPV
HPV Aptima: NEGATIVE
PAP Smear Comment: 0

## 2017-05-16 ENCOUNTER — Ambulatory Visit (INDEPENDENT_AMBULATORY_CARE_PROVIDER_SITE_OTHER): Payer: 59 | Admitting: Physician Assistant

## 2017-05-16 ENCOUNTER — Encounter: Payer: Self-pay | Admitting: Physician Assistant

## 2017-05-16 VITALS — BP 141/97 | HR 118 | Temp 98.7°F | Resp 16 | Ht 64.0 in | Wt 236.0 lb

## 2017-05-16 DIAGNOSIS — R030 Elevated blood-pressure reading, without diagnosis of hypertension: Secondary | ICD-10-CM

## 2017-05-16 DIAGNOSIS — G43009 Migraine without aura, not intractable, without status migrainosus: Secondary | ICD-10-CM | POA: Diagnosis not present

## 2017-05-16 DIAGNOSIS — G4730 Sleep apnea, unspecified: Secondary | ICD-10-CM

## 2017-05-16 MED ORDER — RIZATRIPTAN BENZOATE 10 MG PO TABS: 10.0000 mg | ORAL_TABLET | ORAL | 0 refills | Status: DC | PRN

## 2017-05-16 NOTE — Progress Notes (Signed)
Kathleen Mendez  MRN: [id number] DOB: [birth year]  PCP: Velna Hatchet, MD  Chief Complaint  Patient presents with  . Headache    3 days, all over, throbbing     Subjective:  Pt presents to clinic for headaches - she has had for a long period of time and they are not getting worse but they are starting to interfere more with her life.  The headaches vary - sometimes they are bad enough that she has to just go to sleep other times they are more mild but those tend to turn into the more severe headaches.  There is not a pattern to the headaches but when she gets one they may last for several days.  They are located in different locations in her head.  She has no neck pain with the headaches.  She has photophobia and nausea and clammy feeling skin.  She has relief with caffeine but does not want to drink soda because she is really trying to lose weight.  These are the only types of headaches she gets - sometimes she gets a mild headache but they will always progress to the worse type of headache.She used to use motrin and tylenol because it helped but it is not working well any more - she takes the OTC dose on the bottle.  She has known sleep apnea she thinks from her weight and they tried to do a CPAP titration but she could not tolerate the mask.  She recently went on phenteramine for weight loss and thinks it has helped.  Worse when she goes on trips even fiance has noticed it.    Mom - migraines Sister - migraines  Review of Systems  Eyes: Positive for photophobia.  Musculoskeletal: Negative for neck pain.  Neurological: Positive for headaches. Negative for weakness and numbness.    Patient Active Problem List   Diagnosis Date Noted  . Sleep apnea 05/16/2017  . Morbid obesity ([place]) 05/12/2017  . Sleep disturbance 05/03/2014    Current Outpatient Prescriptions on File Prior to Visit  Medication Sig Dispense Refill  . Biotin 10000 MCG TABS Take 1 tablet by mouth daily.    Marland Kitchen  CAMRESE 0.15-0.03 &0.01 MG tablet Take 1 tablet by mouth daily. 1 Package 3  . levothyroxine (SYNTHROID) 125 MCG tablet Take 1 tablet (125 mcg total) by mouth daily before breakfast. 30 tablet 11  . phentermine (ADIPEX-P) 37.5 MG tablet Take 1 tablet (37.5 mg total) by mouth daily before breakfast. 30 tablet 1   No current facility-administered medications on file prior to visit.     No Known Allergies  Pt patients past, family and social history were reviewed and updated.   Objective:  BP (!) 141/97   Pulse (!) 118   Temp 98.7 F (37.1 C) (Oral)   Resp 16   Ht 5\' 4"  (1.626 m)   Wt 236 lb (107 kg)   LMP 04/16/2017 (Within Weeks)   SpO2 99%   BMI 40.51 kg/m   Physical Exam  Constitutional: She is oriented to person, place, and time and well-developed, well-nourished, and in no distress.  HENT:  Head: Normocephalic and atraumatic.  Right Ear: Hearing and external ear normal.  Left Ear: Hearing and external ear normal.  Eyes: Conjunctivae, EOM and lids are normal. Pupils are equal, round, and reactive to light.  Neck: Normal range of motion.  Cardiovascular: Normal rate, regular rhythm and normal heart sounds.   No murmur heard. Pulmonary/Chest: Effort normal and breath  sounds normal. She has no wheezes.  Neurological: She is alert and oriented to person, place, and time. She has normal sensation, normal strength, normal reflexes and intact cranial nerves. She displays no weakness and facial symmetry. No sensory deficit. Gait normal. Coordination and gait normal.  Skin: Skin is warm and dry.  Psychiatric: Mood, memory, affect and judgment normal.  Vitals reviewed.   Assessment and Plan :  Migraine without aura and without status migrainosus, not intractable - Plan: rizatriptan (MAXALT) 10 MG tablet  Sleep apnea, unspecified type  Elevated BP without diagnosis of hypertension   Sounds like migraines.  I think her elevated BP is related to phentermine and worry and she is  starting to get another headache while in the office.  I am concerned that she has sleep apnea and its role in her headaches but she is not interested in repeating that visits as she feels like if she is able to lose weight that will solve that problem - though we did talk about sleep apnea and the role in decrease weight loss efforts.  We will start a triptan medication - d/w pt how to take and what to expect.  Suggested she track her headaches - currently it sounds like she has about 6 headache days a month but this will allow her to see if she can find a pattern which she has not been able to do from memory.  She will recheck with me in 4-6 weeks.  She is interested in topamax for her headaches but also for the weight loss effects.  I would like to find an abortive medications 1st and have her track her headaches to see if there is a pattern.  We did discuss the possibility of phentermine causing headaches.  Windell Hummingbird PA-C  [facility] 05/16/2017 6:15 PM

## 2017-05-16 NOTE — Patient Instructions (Addendum)
  Migraine buddy - APP to track your headaches   IF you received an x-ray today, you will receive an invoice from [facility]. Please contact [facility] at [phone] with questions or concerns regarding your invoice.   IF you received labwork today, you will receive an invoice from [facility]. Please contact [facility] at [phone] with questions or concerns regarding your invoice.   Our billing staff will not be able to assist you with questions regarding bills from these companies.  You will be contacted with the lab results as soon as they are available. The fastest way to get your results is to activate your My Chart account. Instructions are located on the last page of this paperwork. If you have not heard from [place] regarding the results in 2 weeks, please contact this office.

## 2017-06-26 ENCOUNTER — Ambulatory Visit (INDEPENDENT_AMBULATORY_CARE_PROVIDER_SITE_OTHER): Payer: 59 | Admitting: Obstetrics & Gynecology

## 2017-06-26 ENCOUNTER — Encounter: Payer: Self-pay | Admitting: Obstetrics & Gynecology

## 2017-06-26 MED ORDER — PHENTERMINE HCL 37.5 MG PO TABS: 37.5000 mg | ORAL_TABLET | Freq: Every day | ORAL | 1 refills | Status: DC

## 2017-06-26 NOTE — Progress Notes (Signed)
  History of Present Illness:  Kathleen Mendez is a 36 y.o. who was started on  Phentermine and Synthroid approximately 4 weeks ago. Since that time, she states that her symptoms are improving.  No neg SE to either medicine.  Has lost weight (10 lbs)  PMHx: She  has a past medical history of Abnormal weight gain; Allergy; Anemia; Excessive or frequent menstruation; Human papillomavirus in conditions classified elsewhere and of unspecified site; Mastodynia; Morbid obesity ([place]); Other malaise and fatigue; and Sleep disturbance (05/03/2014). Also,  has a past surgical history that includes Cesarean section., family history includes Cancer in her mother and sister; Glaucoma in her father; Hyperlipidemia in her father; Hypertension in her father and sister; Lymphoma in her father; Osteoarthritis in her father; Seizures in her sister.,  reports that she has never smoked. She has never used smokeless tobacco. She reports that she does not drink alcohol or use drugs.  Current Outpatient Prescriptions:  .  Biotin 10000 MCG TABS, Take 1 tablet by mouth daily., Disp: , Rfl:  .  CAMRESE 0.15-0.03 &0.01 MG tablet, Take 1 tablet by mouth daily., Disp: 1 Package, Rfl: 3 .  levothyroxine (SYNTHROID) 125 MCG tablet, Take 1 tablet (125 mcg total) by mouth daily before breakfast., Disp: 30 tablet, Rfl: 11 .  phentermine (ADIPEX-P) 37.5 MG tablet, Take 1 tablet (37.5 mg total) by mouth daily before breakfast., Disp: 30 tablet, Rfl: 1 .  rizatriptan (MAXALT) 10 MG tablet, Take 1 tablet (10 mg total) by mouth as needed for migraine. May repeat in 2 hours if needed, Disp: 10 tablet, Rfl: 0  . Also, has No Known Allergies..  Review of Systems  All other systems reviewed and are negative.   Physical Exam:  BP 138/88   Pulse (!) 107   Ht 5\' 3"  (1.6 m)   Wt 232 lb (105.2 kg)   LMP 06/26/2017   BMI 41.10 kg/m  Body mass index is 41.1 kg/m. Constitutional: Well nourished, well developed female in no acute distress.    Psych:  Normal mood and affect.    Assessment:  Problem List Items Addressed This Visit      Other   Morbid obesity ([place]) - Primary   Relevant Medications   phentermine (ADIPEX-P) 37.5 MG tablet     Medication treatment is going very well for her weight loss and thyroid.  Plan: She will undergo no change in her medical therapy.  She was amenable to this plan and we will see her back for annual/PRN. A total of 15 minutes were spent face-to-face with the patient during this encounter and over half of that time dealt with counseling and coordination of care.  Thyroid nv  Barnett Applebaum, MD, [facility] 06/26/2017  8:37 AM

## 2017-07-10 MED FILL — LEVOTHYROXINE 125 MCG TABLE: 125 | 30 days supply | Qty: 30 | Fill #0

## 2017-07-10 MED FILL — PHENTERMINE 37.5 MG TABLET: 37.5 | 30 days supply | Qty: 30 | Fill #0

## 2017-08-10 MED FILL — PHENTERMINE 37.5 MG TABLET: 37.5 | 30 days supply | Qty: 30 | Fill #1

## 2017-08-10 MED FILL — LEVOTHYROXINE 125 MCG TABLE: 125 | 30 days supply | Qty: 30 | Fill #1

## 2017-08-12 MED FILL — DAYSEE 0.15-0.03-0.01 MG TA: 0.15-0.03 & | 91 days supply | Qty: 91 | Fill #0

## 2017-08-14 ENCOUNTER — Other Ambulatory Visit: Payer: Self-pay | Admitting: Obstetrics & Gynecology

## 2017-08-14 DIAGNOSIS — Z3041 Encounter for surveillance of contraceptive pills: Secondary | ICD-10-CM

## 2017-08-14 MED ORDER — NORGESTIMATE-ETH ESTRADIOL 0.25-35 MG-MCG PO TABS: 1.0000 | ORAL_TABLET | Freq: Every day | ORAL | 11 refills | Status: DC

## 2017-08-19 ENCOUNTER — Other Ambulatory Visit: Payer: Self-pay

## 2017-08-19 ENCOUNTER — Telehealth: Payer: Self-pay | Admitting: Obstetrics & Gynecology

## 2017-08-19 NOTE — Telephone Encounter (Signed)
Mabeline Caras is calling about patient prescription sent in on OCT 5. Please advise. Ortho- Cycle.

## 2017-08-19 NOTE — Telephone Encounter (Signed)
Pt aware.

## 2017-08-19 NOTE — Telephone Encounter (Signed)
Called pt left message to return call, pharmacy states pt does not need this otho cyclen BC she likes the camres, Pharmacy aware it must of been a mistake to just disregard the prescription, ill let pt know

## 2017-08-28 ENCOUNTER — Encounter: Payer: Self-pay | Admitting: Obstetrics & Gynecology

## 2017-08-28 ENCOUNTER — Ambulatory Visit (INDEPENDENT_AMBULATORY_CARE_PROVIDER_SITE_OTHER): Payer: 59 | Admitting: Obstetrics & Gynecology

## 2017-08-28 DIAGNOSIS — E039 Hypothyroidism, unspecified: Secondary | ICD-10-CM

## 2017-08-28 MED ORDER — PHENTERMINE HCL 37.5 MG PO TABS: 37.5000 mg | ORAL_TABLET | Freq: Every day | ORAL | 1 refills | Status: DC

## 2017-08-28 NOTE — Progress Notes (Signed)
  History of Present Illness:  Kathleen Mendez is a 36 y.o. who was started on  Phentermine approximately 3 months ago due to obesity/abnormal weight gain. The patient has lost 9 pounds over the past 2 mos due to meds and lifestyle changes; goes to [facility] regularly..   She has these side effects: none.  PMHx: She  has a past medical history of Abnormal weight gain; Allergy; Anemia; Excessive or frequent menstruation; Human papillomavirus in conditions classified elsewhere and of unspecified site; Mastodynia; Morbid obesity ([place]); Other malaise and fatigue; and Sleep disturbance (05/03/2014). Also,  has a past surgical history that includes Cesarean section., family history includes Cancer in her mother and sister; Glaucoma in her father; Hyperlipidemia in her father; Hypertension in her father and sister; Lymphoma in her father; Osteoarthritis in her father; Seizures in her sister.,  reports that she has never smoked. She has never used smokeless tobacco. She reports that she does not drink alcohol or use drugs.  She has a current medication list which includes the following prescription(s): biotin, levonorgestrel-ethinyl estradiol, levothyroxine, phentermine, and rizatriptan. Also, has No Known Allergies.  Review of Systems  All other systems reviewed and are negative.   Physical Exam:  BP 120/82   Pulse 85   Ht 5\' 3"  (1.6 m)   Wt 223 lb (101.2 kg)   BMI 39.50 kg/m  Body mass index is 39.5 kg/m. Filed Weights   08/28/17 0806  Weight: 223 lb (101.2 kg)    Physical Exam  Constitutional: She is oriented to person, place, and time. She appears well-developed and well-nourished. No distress.  Musculoskeletal: Normal range of motion.  Neurological: She is alert and oriented to person, place, and time.  Skin: Skin is warm and dry.  Psychiatric: She has a normal mood and affect.  Vitals reviewed.  Assessment: morbid obesity Medication treatment is going well for her.  Plan: Patient is  continued/added to prescription appetite suppressants: Phentermine for 2 more mos then holiday/break and self-directed dieting.   Will continue to assist patient in incorporating positive experiences into her life to promote a positive mental attitude.  Education given regarding appropriate lifestyle changes for weight loss, including regular physical activity, healthy coping strategies, caloric restriction, and healthy eating patterns.  The risks and benefits as well as side effects of medication, such as Phenteramine or [place], is discussed.  The pros and cons of suppressing appetite and boosting metabolism is counseled.  Risks of tolerance and addiction discussed.  Use of medicine will be short term.  Pt to call with any negative side effects and agrees to keep follow up appointments.  Patient doing well with weight loss in progress. Will stop medicine and allow for a drug-free holiday. Patient understands she may need future therapy if weight gain resumes or she does not reach her weight loss goals on her own with good diet and exercise habits.  TSH today, adjust meds as needed Cont OCP for menorrhagia  A total of 15 minutes were spent face-to-face with the patient during this encounter and over half of that time dealt with counseling and coordination of care.  Barnett Applebaum, MD, [facility], [facility] 08/28/2017  8:28 AM

## 2017-08-28 NOTE — Patient Instructions (Signed)
Ethinyl Estradiol; Levonorgestrel tablets What is this medicine? ETHINYL ESTRADIOL; LEVONORGESTREL (ETH in il es tra DYE ole; LEE voh nor jes trel) is an oral contraceptive. It combines two types of female hormones, an estrogen and a progestin. They are used to prevent ovulation and pregnancy. This medicine may be used for other purposes; ask your health care provider or pharmacist if you have questions. COMMON BRAND NAME(S): Alesse, [place], Amethia, Amethia Lo, [place]yst, [place], Aubra-28, Aviane, Camrese, Camrese Lo, [place], Imogene, Delyla, [place], [place], [place], [place], [place], [place], [place], [place], [place], [place], [place], Levonorgestrel/Ethinyl Estradiol, [place], [place], [place], [place], [place], [place], [place], [place], [place], [place], [place], [place], [place], [place], [place], [place], [place], Reinaldo Berber What should I tell my health care provider before I take this medicine? They need to know if you have or ever had any of these conditions: -abnormal vaginal bleeding -blood vessel disease or blood clots -breast, cervical, endometrial, ovarian, liver, or uterine cancer -diabetes -gallbladder disease -heart disease or recent heart attack -high blood pressure -high cholesterol -kidney disease -liver disease -migraine headaches -stroke -systemic lupus erythematosus (SLE) -tobacco smoker -an unusual or allergic reaction to estrogens, progestins, other medicines, foods, dyes, or preservatives -pregnant or trying to get pregnant -breast-feeding How should I use this medicine? Take this medicine by mouth. To reduce nausea, this medicine may be taken with food. Follow the directions on the prescription label. Take this medicine at the same time each day and in the order directed on the package. Do not take your medicine more often than directed. Contact your pediatrician regarding the use of this medicine in children. Special care may be  needed. This medicine has been used in female children who have started having menstrual periods. A patient package insert for the product will be given with each prescription and refill. Read this sheet carefully each time. The sheet may change frequently. Overdosage: If you think you have taken too much of this medicine contact a poison control center or emergency room at once. NOTE: This medicine is only for you. Do not share this medicine with others. What if I miss a dose? If you miss a dose, refer to the patient information sheet you received with your medicine for direction. If you miss more than one pill, this medicine may not be as effective and you may need to use another form of birth control. What may interact with this medicine? Do not take this medicine with the following medication: -dasabuvir; ombitasvir; paritaprevir; ritonavir -ombitasvir; paritaprevir; ritonavir This medicine may also interact with the following medications: -acetaminophen -antibiotics or medicines for infections, especially rifampin, rifabutin, rifapentine, and griseofulvin, and possibly penicillins or tetracyclines -aprepitant -ascorbic acid (vitamin C) -atorvastatin -barbiturate medicines, such as phenobarbital -bosentan -carbamazepine -caffeine -clofibrate -cyclosporine -dantrolene -doxercalciferol -felbamate -grapefruit juice -hydrocortisone -medicines for anxiety or sleeping problems, such as diazepam or temazepam -medicines for diabetes, including pioglitazone -mineral oil -modafinil -mycophenolate -nefazodone -oxcarbazepine -phenytoin -prednisolone -ritonavir or other medicines for HIV infection or AIDS -rosuvastatin -selegiline -soy isoflavones supplements -St. John's wort -tamoxifen or raloxifene -theophylline -thyroid hormones -topiramate -warfarin This list may not describe all possible interactions. Give your health care provider a list of all the medicines, herbs,  non-prescription drugs, or dietary supplements you use. Also tell them if you smoke, drink alcohol, or use illegal drugs. Some items may interact with your medicine. What should I watch for while using this medicine? Visit your doctor or health care professional for regular checks on your progress. You will need a regular breast and pelvic  exam and Pap smear while on this medicine. Use an additional method of contraception during the first cycle that you take these tablets. If you have any reason to think you are pregnant, stop taking this medicine right away and contact your doctor or health care professional. If you are taking this medicine for hormone related problems, it may take several cycles of use to see improvement in your condition. Smoking increases the risk of getting a blood clot or having a stroke while you are taking birth control pills, especially if you are more than 35 years old. You are strongly advised not to smoke. This medicine can make your body retain fluid, making your fingers, hands, or ankles swell. Your blood pressure can go up. Contact your doctor or health care professional if you feel you are retaining fluid. This medicine can make you more sensitive to the sun. Keep out of the sun. If you cannot avoid being in the sun, wear protective clothing and use sunscreen. Do not use sun lamps or tanning beds/booths. If you wear contact lenses and notice visual changes, or if the lenses begin to feel uncomfortable, consult your eye care specialist. In some women, tenderness, swelling, or minor bleeding of the gums may occur. Notify your dentist if this happens. Brushing and flossing your teeth regularly may help limit this. See your dentist regularly and inform your dentist of the medicines you are taking. If you are going to have elective surgery, you may need to stop taking this medicine before the surgery. Consult your health care professional for advice. This medicine does not  protect you against HIV infection (AIDS) or any other sexually transmitted diseases. What side effects may I notice from receiving this medicine? Side effects that you should report to your doctor or health care professional as soon as possible: -breast tissue changes or discharge -changes in vaginal bleeding during your period or between your periods -chest pain -coughing up blood -dizziness or fainting spells -headaches or migraines -leg, arm or groin pain -severe or sudden headaches -stomach pain (severe) -sudden shortness of breath -sudden loss of coordination, especially on one side of the body -speech problems -symptoms of vaginal infection like itching, irritation or unusual discharge -tenderness in the upper abdomen -vomiting -weakness or numbness in the arms or legs, especially on one side of the body -yellowing of the eyes or skin Side effects that usually do not require medical attention (report to your doctor or health care professional if they continue or are bothersome): -breakthrough bleeding and spotting that continues beyond the 3 initial cycles of pills -breast tenderness -mood changes, anxiety, depression, frustration, anger, or emotional outbursts -increased sensitivity to sun or ultraviolet light -nausea -skin rash, acne, or brown spots on the skin -weight gain (slight) This list may not describe all possible side effects. Call your doctor for medical advice about side effects. You may report side effects to FDA at 1-800-FDA-1088. Where should I keep my medicine? Keep out of the reach of children. Store at room temperature between 15 and 30 degrees C (59 and 86 degrees F). Throw away any unused medicine after the expiration date. NOTE: This sheet is a summary. It may not cover all possible information. If you have questions about this medicine, talk to your doctor, pharmacist, or health care provider.  2018 Elsevier/Gold Standard (2016-07-07 07:58:22)  

## 2017-08-29 LAB — TSH: TSH: 0.03 u[IU]/mL — ABNORMAL LOW (ref 0.450–4.500)

## 2017-09-01 NOTE — Progress Notes (Signed)
LM.  Thyroid med dose change needed.

## 2017-09-09 MED FILL — LEVOTHYROXINE 125 MCG TABLE: 125 | 30 days supply | Qty: 30 | Fill #2

## 2017-09-09 MED FILL — PHENTERMINE 37.5 MG TABLET: 37.5 | 30 days supply | Qty: 30 | Fill #0

## 2017-09-18 ENCOUNTER — Other Ambulatory Visit: Payer: Self-pay | Admitting: Obstetrics & Gynecology

## 2017-09-18 DIAGNOSIS — E038 Other specified hypothyroidism: Secondary | ICD-10-CM

## 2017-09-18 MED ORDER — LEVOTHYROXINE SODIUM 112 MCG PO TABS: 112.0000 ug | ORAL_TABLET | Freq: Every day | ORAL | 9 refills | Status: DC

## 2017-09-18 MED FILL — LEVOTHYROXINE 112 MCG TABLE: 112 | 30 days supply | Qty: 30 | Fill #0

## 2017-10-08 MED FILL — PHENTERMINE 37.5 MG TABLET: 37.5 | 30 days supply | Qty: 30 | Fill #1

## 2017-10-16 MED FILL — LEVOTHYROXINE 112 MCG TABLE: 112 | 30 days supply | Qty: 30 | Fill #1

## 2017-11-17 MED FILL — LEVOTHYROXINE 112 MCG TAB: 112 | 30 days supply | Qty: 30 | Fill #2

## 2017-12-15 MED FILL — LEVOTHYROXINE 112 MCG TAB: 112 | 30 days supply | Qty: 30 | Fill #3

## 2018-01-18 MED FILL — LEVOTHYROXINE 112 MCG TAB: 112 | 30 days supply | Qty: 30 | Fill #4

## 2018-02-17 MED FILL — LEVOTHYROXINE 112 MCG TAB: 112 | 30 days supply | Qty: 30 | Fill #5

## 2018-03-19 MED FILL — LEVOTHYROXINE 112 MCG TAB: 112 | 30 days supply | Qty: 30 | Fill #6

## 2018-04-21 MED FILL — LEVOTHYROXINE 112 MCG TAB: 112 | 30 days supply | Qty: 30 | Fill #7

## 2018-05-24 ENCOUNTER — Encounter: Payer: Self-pay | Admitting: Emergency Medicine

## 2018-05-24 ENCOUNTER — Ambulatory Visit (INDEPENDENT_AMBULATORY_CARE_PROVIDER_SITE_OTHER): Payer: Self-pay | Admitting: Emergency Medicine

## 2018-05-24 VITALS — BP 124/90 | HR 105 | Temp 98.4°F | Resp 18 | Wt 233.8 lb

## 2018-05-24 DIAGNOSIS — M79622 Pain in left upper arm: Secondary | ICD-10-CM

## 2018-05-24 MED ORDER — MELOXICAM 15 MG PO TABS: 15.0000 mg | ORAL_TABLET | Freq: Every day | ORAL | 0 refills | Status: DC

## 2018-05-24 MED ORDER — CYCLOBENZAPRINE HCL 5 MG PO TABS: 5.0000 mg | ORAL_TABLET | Freq: Two times a day (BID) | ORAL | 0 refills | Status: DC | PRN

## 2018-05-24 MED FILL — LEVOTHYROXINE 112 MCG TAB: 112 | 30 days supply | Qty: 30 | Fill #8

## 2018-05-24 NOTE — Patient Instructions (Addendum)
Tramadol is strong pain medication. While taking, do not drink alcohol, drive, or perform any other activities that requires focus while taking these medications.   Meloxicam (Mobic) is an antiinflammatory to help with pain and inflammation.  Do not take ibuprofen, Advil, Aleve, or any other medications that contain NSAIDs while taking meloxicam as this may cause stomach upset or even ulcers if taken in large amounts for an extended period of time.   Flexeril is a muscle relaxer and may cause drowsiness. Do not drink alcohol, drive, or operate heavy machinery while taking.  Please follow up with family medicine or sports medicine in 1 week if not improving.   Shoulder Pain Many things can cause shoulder pain, including:  An injury.  Moving the arm in the same way again and again (overuse).  Joint pain (arthritis).  Follow these instructions at home: Take these actions to help with your pain:  Squeeze a soft ball or a foam pad as much as you can. This helps to prevent swelling. It also makes the arm stronger.  Take over-the-counter and prescription medicines only as told by your doctor.  If told, put ice on the area: ? Put ice in a plastic bag. ? Place a towel between your skin and the bag. ? Leave the ice on for 20 minutes, 2-3 times per day. Stop putting on ice if it does not help with the pain.  If you were given a shoulder sling or immobilizer: ? Wear it as told. ? Remove it to shower or bathe. ? Move your arm as little as possible. ? Keep your hand moving. This helps prevent swelling.  Contact a doctor if:  Your pain gets worse.  Medicine does not help your pain.  You have new pain in your arm, hand, or fingers. Get help right away if:  Your arm, hand, or fingers: ? Tingle. ? Are numb. ? Are swollen. ? Are painful. ? Turn white or blue. This information is not intended to replace advice given to you by your health care provider. Make sure you discuss any  questions you have with your health care provider. Document Released: 04/14/2008 Document Revised: 06/22/2016 Document Reviewed: 02/19/2015 Elsevier Interactive Patient Education  2018 [employer].  Shoulder Range of Motion Exercises Shoulder range of motion (ROM) exercises are designed to keep the shoulder moving freely. They are often recommended for people who have shoulder pain. Phase 1 exercises When you are able, do this exercise 5-6 days per week, or as told by your health care provider. Work toward doing 2 sets of 10 swings. Pendulum Exercise How To Do This Exercise Lying Down 1. Lie face-down on a bed with your abdomen close to the side of the bed. 2. Let your arm hang over the side of the bed. 3. Relax your shoulder, arm, and hand. 4. Slowly and gently swing your arm forward and back. Do not use your neck muscles to swing your arm. They should be relaxed. If you are struggling to swing your arm, have someone gently swing it for you. When you do this exercise for the first time, swing your arm at a 15 degree angle for 15 seconds, or swing your arm 10 times. As pain lessens over time, increase the angle of the swing to 30-45 degrees. 5. Repeat steps 1-4 with the other arm.  How To Do This Exercise While Standing 1. Stand next to a sturdy chair or table and hold on to it with your hand. [place]  forward at the waist. 2. Bend your knees slightly. 3. Relax your other arm and let it hang limp. 4. Relax the shoulder blade of the arm that is hanging and let it drop. 5. While keeping your shoulder relaxed, use body motion to swing your arm in small circles. The first time you do this exercise, swing your arm for about 30 seconds or 10 times. When you do it next time, swing your arm for a little longer. 6. Stand up tall and relax. 7. Repeat steps 1-7, this time changing the direction of the circles. 2. Repeat steps 1-8 with the other arm.  Phase 2 exercises Do these exercises 3-4 times  per day on 5-6 days per week or as told by your health care provider. Work toward holding the stretch for 20 seconds. Stretching Exercise 1 1. Lift your arm straight out in front of you. 2. Bend your arm 90 degrees at the elbow (right angle) so your forearm goes across your body and looks like the letter "L." 3. Use your other arm to gently pull the elbow forward and across your body. 4. Repeat steps 1-3 with the other arm. Stretching Exercise 2 You will need a towel or rope for this exercise. 1. Bend one arm behind your back with the palm facing outward. 2. Hold a towel with your other hand. 3. Reach the arm that holds the towel above your head, and bend that arm at the elbow. Your wrist should be behind your neck. 4. Use your free hand to grab the free end of the towel. 5. With the higher hand, gently pull the towel up behind you. 6. With the lower hand, pull the towel down behind you. 7. Repeat steps 1-6 with the other arm.  Phase 3 exercises Do each of these exercises at four different times of day (sessions) every day or as told by your health care provider. To begin with, repeat each exercise 5 times (repetitions). Work toward doing 3 sets of 12 repetitions or as told by your health care provider. Strengthening Exercise 1 You will need a light weight for this activity. As you grow stronger, you may use a heavier weight. 1. Standing with a weight in your hand, lift your arm straight out to the side until it is at the same height as your shoulder. 2. Bend your arm at 90 degrees so that your fingers are pointing to the ceiling. 3. Slowly raise your hand until your arm is straight up in the air. 4. Repeat steps 1-3 with the other arm.  Strengthening Exercise 2 You will need a light weight for this activity. As you grow stronger, you may use a heavier weight. 1. Standing with a weight in your hand, gradually move your straight arm in an arc, starting at your side, then out in front of  you, then straight up over your head. 2. Gradually move your other arm in an arc, starting at your side, then out in front of you, then straight up over your head. 3. Repeat steps 1-2 with the other arm.  Strengthening Exercise 3 You will need an elastic band for this activity. As you grow stronger, gradually increase the size of the bands or increase the number of bands that you use at one time. 1. While standing, hold an elastic band in one hand and raise that arm up in the air. 2. With your other hand, pull down the band until that hand is by your side. 3. Repeat steps  1-2 with the other arm.  This information is not intended to replace advice given to you by your health care provider. Make sure you discuss any questions you have with your health care provider. Document Released: 07/26/2003 Document Revised: 06/22/2016 Document Reviewed: 10/23/2014 Elsevier Interactive Patient Education  [employer].

## 2018-05-24 NOTE — Progress Notes (Signed)
   Subjective:    Patient ID: Kathleen Mendez, female    DOB: 1981/05/21, 37 y.o.   MRN: [id number]  HPI Kathleen Mendez is a 37 y.o. female presenting to UC with c/o 2 days of suddenly worsening Left shoulder/upper arm pain.  She noticed mild pain last night but when she woke this morning she had moderate to severe pain in her Left upper arm. Pain is worse with overhead reaching or moving Left shoulder. Denies numbness or tingling down arm. Pain stops just above her elbow in her biceps.  She took Naproxen and ibuprofen, used ice and rest but no relief today. No known injury. No falls or heavy lifting. She carries her pocketbook on her Right side. Denies neck or back pain.     Review of Systems  Musculoskeletal: Positive for arthralgias and myalgias. Negative for back pain, neck pain and neck stiffness.  Skin: Negative for color change, rash and wound.  Neurological: Negative for weakness and numbness.       Objective:   Physical Exam  Constitutional: She is oriented to person, place, and time. She appears well-developed and well-nourished.  HENT:  Head: Normocephalic and atraumatic.  Eyes: EOM are normal.  Neck: Normal range of motion.  Cardiovascular: Normal rate.  Pulses:      Radial pulses are 2+ on the left side.  Pulmonary/Chest: Effort normal.  Musculoskeletal: She exhibits tenderness. She exhibits no edema.  No midline spinal tenderness. No tenderness to upper back. No bony tenderness of shoulder joint or elbow. Tenderness to proximal deltoid, anterior and lateral aspect. No tenderness to biceps groove.  Slight decreased abduction due to pain. Full adduction.  Neurological: She is alert and oriented to person, place, and time.  Skin: Skin is warm and dry. Capillary refill takes less than 2 seconds.  Psychiatric: She has a normal mood and affect. Her behavior is normal.  Nursing note and vitals reviewed.      Assessment & Plan:   Hx and exam c/w muscle strain vs acromial  bursitis.  Will tx with tramdaol 50mg  every 8 hours as needed for moderate to severe pain (try to only take at night due to drowsiness), Meloxicam 15mg  once daily and flexeril 5-10mg  BID PRN for muscle pain  F/u with PCP in 1 week if not improving.  Home instructions provided.

## 2018-05-29 DIAGNOSIS — M25512 Pain in left shoulder: Secondary | ICD-10-CM | POA: Diagnosis not present

## 2018-05-29 DIAGNOSIS — S43422A Sprain of left rotator cuff capsule, initial encounter: Secondary | ICD-10-CM | POA: Diagnosis not present

## 2018-06-23 MED FILL — LEVOTHYROXINE 112 MCG TAB: 112 | 30 days supply | Qty: 30 | Fill #9

## 2018-07-23 ENCOUNTER — Other Ambulatory Visit: Payer: Self-pay | Admitting: Obstetrics & Gynecology

## 2018-07-26 MED FILL — LEVOTHYROXINE 112 MCG TAB: 112 | 30 days supply | Qty: 30 | Fill #0

## 2018-08-27 MED FILL — LEVOTHYROXINE 112 MCG TAB: 112 | 30 days supply | Qty: 30 | Fill #1

## 2018-09-28 MED FILL — LEVOTHYROXINE 112 MCG TAB: 112 | 30 days supply | Qty: 30 | Fill #2

## 2018-11-01 MED FILL — LEVOTHYROXINE 112 MCG TAB: 112 | 30 days supply | Qty: 30 | Fill #3

## 2018-12-08 MED FILL — LEVOTHYROXINE 112 MCG TAB: 112 | 30 days supply | Qty: 30 | Fill #4

## 2019-06-17 ENCOUNTER — Ambulatory Visit: Payer: 59 | Admitting: Obstetrics & Gynecology

## 2019-08-15 ENCOUNTER — Ambulatory Visit (INDEPENDENT_AMBULATORY_CARE_PROVIDER_SITE_OTHER): Payer: 59 | Admitting: Obstetrics & Gynecology

## 2019-08-15 ENCOUNTER — Other Ambulatory Visit: Payer: Self-pay

## 2019-08-15 ENCOUNTER — Encounter: Payer: Self-pay | Admitting: Obstetrics & Gynecology

## 2019-08-15 VITALS — BP 120/80 | Ht 62.0 in | Wt 251.0 lb

## 2019-08-15 DIAGNOSIS — Z1321 Encounter for screening for nutritional disorder: Secondary | ICD-10-CM | POA: Diagnosis not present

## 2019-08-15 DIAGNOSIS — Z13220 Encounter for screening for lipoid disorders: Secondary | ICD-10-CM

## 2019-08-15 DIAGNOSIS — Z131 Encounter for screening for diabetes mellitus: Secondary | ICD-10-CM

## 2019-08-15 DIAGNOSIS — E039 Hypothyroidism, unspecified: Secondary | ICD-10-CM | POA: Diagnosis not present

## 2019-08-15 DIAGNOSIS — Z01419 Encounter for gynecological examination (general) (routine) without abnormal findings: Secondary | ICD-10-CM

## 2019-08-15 MED ORDER — PHENTERMINE HCL 37.5 MG PO TABS: ORAL_TABLET | ORAL | 0 refills | Status: DC

## 2019-08-15 MED FILL — PHENTERMINE 37.5 MG TABLET: 37.5 | 30 days supply | Qty: 30 | Fill #0

## 2019-08-15 NOTE — Patient Instructions (Signed)
Phentermine tablets or capsules What is this medicine? PHENTERMINE (FEN ter meen) decreases your appetite. It is used with a reduced calorie diet and exercise to help you lose weight. This medicine may be used for other purposes; ask your health care provider or pharmacist if you have questions. COMMON BRAND NAME(S): Adipex-P, Atti-Plex P, Atti-Plex P Spansule, Fastin, Lomaira, Pro-Fast, Tara-8 What should I tell my health care provider before I take this medicine? They need to know if you have any of these conditions:  agitation or nervousness  diabetes  glaucoma  heart disease  high blood pressure  history of drug abuse or addiction  history of stroke  kidney disease  lung disease called Primary Pulmonary Hypertension (PPH)  taken an MAOI like Carbex, Eldepryl, Marplan, Nardil, or Parnate in last 14 days  taking stimulant medicines for attention disorders, weight loss, or to stay awake  thyroid disease  an unusual or allergic reaction to phentermine, other medicines, foods, dyes, or preservatives  pregnant or trying to get pregnant  breast-feeding How should I use this medicine? Take this medicine by mouth with a glass of water. Follow the directions on the prescription label. The instructions for use may differ based on the product and dose you are taking. Avoid taking this medicine in the evening. It may interfere with sleep. Take your doses at regular intervals. Do not take your medicine more often than directed. Talk to your pediatrician regarding the use of this medicine in children. While this drug may be prescribed for children 17 years or older for selected conditions, precautions do apply. Overdosage: If you think you have taken too much of this medicine contact a poison control center or emergency room at once. NOTE: This medicine is only for you. Do not share this medicine with others. What if I miss a dose? If you miss a dose, take it as soon as you can. If it  is almost time for your next dose, take only that dose. Do not take double or extra doses. What may interact with this medicine? Do not take this medicine with any of the following medications:  MAOIs like Carbex, Eldepryl, Marplan, Nardil, and Parnate  medicines for colds or breathing difficulties like pseudoephedrine or phenylephrine  procarbazine  sibutramine  stimulant medicines for attention disorders, weight loss, or to stay awake This medicine may also interact with the following medications:  certain medicines for depression, anxiety, or psychotic disturbances  linezolid  medicines for diabetes  medicines for high blood pressure This list may not describe all possible interactions. Give your health care provider a list of all the medicines, herbs, non-prescription drugs, or dietary supplements you use. Also tell them if you smoke, drink alcohol, or use illegal drugs. Some items may interact with your medicine. What should I watch for while using this medicine? Notify your physician immediately if you become short of breath while doing your normal activities. Do not take this medicine within 6 hours of bedtime. It can keep you from getting to sleep. Avoid drinks that contain caffeine and try to stick to a regular bedtime every night. This medicine was intended to be used in addition to a healthy diet and exercise. The best results are achieved this way. This medicine is only indicated for short-term use. Eventually your weight loss may level out. At that point, the drug will only help you maintain your new weight. Do not increase or in any way change your dose without consulting your doctor. You may get   drowsy or dizzy. Do not drive, use machinery, or do anything that needs mental alertness until you know how this medicine affects you. Do not stand or sit up quickly, especially if you are an older patient. This reduces the risk of dizzy or fainting spells. Alcohol may increase  dizziness and drowsiness. Avoid alcoholic drinks. What side effects may I notice from receiving this medicine? Side effects that you should report to your doctor or health care professional as soon as possible:  allergic reactions like skin rash, itching or hives, swelling of the face, lips, or tongue)  anxiety  breathing problems  changes in vision  chest pain or chest tightness  depressed mood or other mood changes  hallucinations, loss of contact with reality  fast, irregular heartbeat  increased blood pressure  irritable  nervousness or restlessness  painful urination  palpitations  tremors  trouble sleeping  seizures  signs and symptoms of a stroke like changes in vision; confusion; trouble speaking or understanding; severe headaches; sudden numbness or weakness of the face, arm or leg; trouble walking; dizziness; loss of balance or coordination  unusually weak or tired  vomiting Side effects that usually do not require medical attention (report to your doctor or health care professional if they continue or are bothersome):  constipation or diarrhea  dry mouth  headache  nausea  stomach upset  sweating This list may not describe all possible side effects. Call your doctor for medical advice about side effects. You may report side effects to FDA at 1-800-FDA-1088. Where should I keep my medicine? Keep out of the reach of children. This medicine can be abused. Keep your medicine in a safe place to protect it from theft. Do not share this medicine with anyone. Selling or giving away this medicine is dangerous and against the law. This medicine may cause accidental overdose and death if taken by other adults, children, or pets. Mix any unused medicine with a substance like cat litter or coffee grounds. Then throw the medicine away in a sealed container like a sealed bag or a coffee can with a lid. Do not use the medicine after the expiration date. Store at  room temperature between 20 and 25 degrees C (68 and 77 degrees F). Keep container tightly closed. NOTE: This sheet is a summary. It may not cover all possible information. If you have questions about this medicine, talk to your doctor, pharmacist, or health care provider.  2020 Elsevier/Gold Standard (2017-04-10 08:23:13)  

## 2019-08-15 NOTE — Progress Notes (Signed)
HPI:      [name] is a 38 y.o. [id number] who LMP was Patient's last menstrual period was 08/13/2019., she presents today for her annual examination. The patient has no complaints today. The patient is not currently sexually active. Her last pap: approximate date 2018 and was normal. The patient does perform self breast exams.  There is no notable family history of breast or ovarian cancer in her family.  The patient has regular exercise: yes.  The patient denies current symptoms of depression.    Stress and some depression, lost father last year (already has lost mother, sister).  GYN History: Contraception: abstinence  PMHx: Past Medical History:  Diagnosis Date  . Abnormal weight gain   . Allergy   . Anemia   . Excessive or frequent menstruation   . Human papillomavirus in conditions classified elsewhere and of unspecified site   . Mastodynia   . Morbid obesity (Kathleen Mendez)   . Other malaise and fatigue   . Sleep disturbance 05/03/2014   Past Surgical History:  Procedure Laterality Date  . CESAREAN SECTION     x2   Family History  Problem Relation Age of Onset  . Cancer Mother        Lung  . Hypertension Father   . Osteoarthritis Father   . Hyperlipidemia Father   . Glaucoma Father   . Lymphoma Father   . Cancer Sister        Lung  . Hypertension Sister   . Seizures Sister    Social History   Tobacco Use  . Smoking status: Never Smoker  . Smokeless tobacco: Never Used  Substance Use Topics  . Alcohol use: No  . Drug use: No    Current Outpatient Medications:  .  Biotin 10000 MCG TABS, Take 1 tablet by mouth daily., Disp: , Rfl:  .  cyclobenzaprine (FLEXERIL) 5 MG tablet, Take 1-2 tablets (5-10 mg total) by mouth 2 (two) times daily as needed for muscle spasms., Disp: 20 tablet, Rfl: 0 .  Levonorgestrel-Ethinyl Estradiol (DAYSEE) 0.15-0.03 &0.01 MG tablet, Take 1 tablet by mouth daily., Disp: , Rfl:  .  levothyroxine (SYNTHROID, LEVOTHROID) 112 MCG tablet,  TAKE 1 TABLET (112 MCG TOTAL) DAILY BEFORE BREAKFAST BY MOUTH., Disp: 30 tablet, Rfl: 9 .  meloxicam (MOBIC) 15 MG tablet, Take 1 tablet (15 mg total) by mouth daily., Disp: 20 tablet, Rfl: 0 .  phentermine (ADIPEX-P) 37.5 MG tablet, One tablet po in morning., Disp: 30 tablet, Rfl: 0 .  rizatriptan (MAXALT) 10 MG tablet, Take 1 tablet (10 mg total) by mouth as needed for migraine. May repeat in 2 hours if needed (Patient not taking: Reported on 05/24/2018), Disp: 10 tablet, Rfl: 0 Allergies: Patient has no known allergies.  Review of Systems  Constitutional: Positive for malaise/fatigue. Negative for chills and fever.  HENT: Negative for congestion, sinus pain and sore throat.   Eyes: Negative for blurred vision and pain.  Respiratory: Negative for cough and wheezing.   Cardiovascular: Negative for chest pain and leg swelling.  Gastrointestinal: Negative for abdominal pain, constipation, diarrhea, heartburn, nausea and vomiting.  Genitourinary: Negative for dysuria, frequency, hematuria and urgency.  Musculoskeletal: Negative for back pain, joint pain, myalgias and neck pain.  Skin: Negative for itching and rash.  Neurological: Negative for dizziness, tremors and weakness.  Endo/Heme/Allergies: Does not bruise/bleed easily.  Psychiatric/Behavioral: Positive for depression. The patient is not nervous/anxious and does not have insomnia.     Objective: BP 120/80  Ht 5\' 2"  (1.575 m)   Wt 251 lb (113.9 kg)   LMP 08/13/2019   BMI 45.91 kg/m   Filed Weights   08/15/19 0805  Weight: 251 lb (113.9 kg)   Body mass index is 45.91 kg/m. Physical Exam Constitutional:      General: She is not in acute distress.    Appearance: She is well-developed.  Cardiovascular:     Rate and Rhythm: Normal rate.     Heart sounds: Normal heart sounds. No murmur. No friction rub. No gallop.   Pulmonary:     Effort: Pulmonary effort is normal.     Breath sounds: Normal breath sounds.  Musculoskeletal:  Normal range of motion.  Neurological:     Mental Status: She is alert and oriented to person, place, and time.  Skin:    General: Skin is warm and dry.  Vitals signs reviewed.     Assessment:  ANNUAL EXAM 1. Women's annual routine gynecological examination   2. Acquired hypothyroidism   3. Morbid obesity (Kathleen Mendez)   4. Screening for diabetes mellitus   5. Encounter for vitamin deficiency screening   6. Screening for cholesterol level      Screening Plan:            1.  Cervical Screening-  recent Pap result reviewed with patient, due 2021  2. Breast screening- Exam annually and mammogram>40 planned   3. Labs Ordered today  4. Counseling for contraception: abstinence   5. Acquired hypothyroidism - Recheck levels, consider restarting medicine - TSH   6. Morbid obesity (HCC) - Phentermine Rx, pros and cons - Lipid panel - Hemoglobin A1c  Exam (pelvic) deferred due to bleeding today.  Will plan exam and BP check in 3 weeks.    F/U  Return in about 3 weeks (around 09/05/2019) for Follow up.  Kathleen Applebaum, MD, [facility], Kathleen Mendez 08/15/2019  8:31 AM

## 2019-08-16 ENCOUNTER — Other Ambulatory Visit: Payer: Self-pay | Admitting: Obstetrics & Gynecology

## 2019-08-16 LAB — HEMOGLOBIN A1C
Est. average glucose Bld gHb Est-mCnc: 114 mg/dL
Hgb A1c MFr Bld: 5.6 % (ref 4.8–5.6)

## 2019-08-16 LAB — VITAMIN D 25 HYDROXY (VIT D DEFICIENCY, FRACTURES): Vit D, 25-Hydroxy: 17.3 ng/mL — ABNORMAL LOW (ref 30.0–100.0)

## 2019-08-16 LAB — LIPID PANEL
Chol/HDL Ratio: 4.1 ratio (ref 0.0–4.4)
Cholesterol, Total: 189 mg/dL (ref 100–199)
HDL: 46 mg/dL (ref >39)
LDL Chol Calc (NIH): 121 mg/dL — ABNORMAL HIGH (ref 0–99)
Triglycerides: 120 mg/dL (ref 0–149)
VLDL Cholesterol Cal: 22 mg/dL (ref 5–40)

## 2019-08-16 LAB — TSH: TSH: 8.24 u[IU]/mL — ABNORMAL HIGH (ref 0.450–4.500)

## 2019-08-16 MED ORDER — VITAMIN D (ERGOCALCIFEROL) 1.25 MG (50000 UNIT) PO CAPS: 50000.0000 [IU] | ORAL_CAPSULE | ORAL | 2 refills | Status: DC

## 2019-08-16 MED ORDER — LEVOTHYROXINE SODIUM 112 MCG PO TABS: 112.0000 ug | ORAL_TABLET | Freq: Every day | ORAL | 9 refills | Status: DC

## 2019-08-16 MED FILL — LEVOTHYROXINE 112 MCG TAB: 112 | 30 days supply | Qty: 30 | Fill #0

## 2019-08-16 MED FILL — VIT D2 1.25 MG (50,000 UNIT: 1.25 MG | 28 days supply | Qty: 4 | Fill #0

## 2019-09-15 MED FILL — VIT D2 1.25 MG (50,000 UNIT: 1.25 MG | 28 days supply | Qty: 4 | Fill #1

## 2019-09-15 MED FILL — LEVOTHYROXINE 112 MCG TAB: 112 | 30 days supply | Qty: 30 | Fill #1

## 2019-09-16 ENCOUNTER — Ambulatory Visit: Payer: 59 | Admitting: Obstetrics & Gynecology

## 2019-09-16 ENCOUNTER — Other Ambulatory Visit: Payer: Self-pay | Admitting: Obstetrics & Gynecology

## 2019-10-03 ENCOUNTER — Other Ambulatory Visit: Payer: Self-pay

## 2019-10-03 ENCOUNTER — Ambulatory Visit (INDEPENDENT_AMBULATORY_CARE_PROVIDER_SITE_OTHER): Payer: 59 | Admitting: Obstetrics & Gynecology

## 2019-10-03 ENCOUNTER — Encounter: Payer: Self-pay | Admitting: Obstetrics & Gynecology

## 2019-10-03 VITALS — BP 120/80 | HR 98 | Wt 238.0 lb

## 2019-10-03 DIAGNOSIS — E039 Hypothyroidism, unspecified: Secondary | ICD-10-CM | POA: Diagnosis not present

## 2019-10-03 DIAGNOSIS — Z6841 Body Mass Index (BMI) 40.0 and over, adult: Secondary | ICD-10-CM | POA: Diagnosis not present

## 2019-10-03 MED ORDER — PHENTERMINE HCL 37.5 MG PO TABS: ORAL_TABLET | ORAL | 1 refills | Status: DC

## 2019-10-03 MED FILL — PHENTERMINE 37.5 MG TABLET: 37.5 | 30 days supply | Qty: 30 | Fill #0

## 2019-10-03 NOTE — Progress Notes (Signed)
  History of Present Illness:  Kathleen Mendez is a 38 y.o. who was started on Phentermine approximately 5 weeks ago due to obesity/abnormal weight gain. The patient has lost 13 pounds over the past month due to lifestyle measures, Phentermine, and restarting thyroid medicine after a lapse..   She has these side effects: none.  PMHx: She  has a past medical history of Abnormal weight gain, Allergy, Anemia, Excessive or frequent menstruation, Human papillomavirus in conditions classified elsewhere and of unspecified site, Mastodynia, Morbid obesity ([place]), Other malaise and fatigue, and Sleep disturbance (05/03/2014). Also,  has a past surgical history that includes Cesarean section., family history includes Cancer in her mother and sister; Glaucoma in her father; Hyperlipidemia in her father; Hypertension in her father and sister; Lymphoma in her father; Osteoarthritis in her father; Seizures in her sister.,  reports that she has never smoked. She has never used smokeless tobacco. She reports that she does not drink alcohol or use drugs.  She has a current medication list which includes the following prescription(s): biotin, levothyroxine, vitamin d (ergocalciferol), and phentermine. Also, has No Known Allergies.  Review of Systems  All other systems reviewed and are negative.   Physical Exam:  BP 120/80   Pulse 98   Wt 238 lb (108 kg)   BMI 43.53 kg/m  Body mass index is 43.53 kg/m. Filed Weights   10/03/19 0827  Weight: 238 lb (108 kg)    Physical Exam Constitutional:      General: She is not in acute distress.    Appearance: She is well-developed.  Musculoskeletal: Normal range of motion.  Neurological:     Mental Status: She is alert and oriented to person, place, and time.  Skin:    General: Skin is warm and dry.  Vitals signs reviewed.     Assessment: morbid obesity Medication treatment is going well for her.  Plan: Patient is continued/added to prescription appetite  suppressants: Phentermine, self-directed dieting and exercise; also will assess Thyroid level for correct dosing.   Will continue to assist patient in incorporating positive experiences into her life to promote a positive mental attitude.  Education given regarding appropriate lifestyle changes for weight loss, including regular physical activity, healthy coping strategies, caloric restriction, and healthy eating patterns.  The risks and benefits as well as side effects of medication, such as Phenteramine or Tenuate, is discussed.  The pros and cons of suppressing appetite and boosting metabolism is counseled.  Risks of tolerance and addiction discussed.  Use of medicine will be short term.  Pt to call with any negative side effects and agrees to keep follow up appointments.  A total of 15 minutes were spent face-to-face with the patient during this encounter and over half of that time dealt with counseling and coordination of care.  Barnett Applebaum, MD, [facility], [facility] 10/03/2019  8:53 AM

## 2019-10-04 LAB — TSH: TSH: 1.25 u[IU]/mL (ref 0.450–4.500)

## 2019-10-14 MED FILL — LEVOTHYROXINE 112 MCG TAB: 112 | 30 days supply | Qty: 30 | Fill #2

## 2019-10-14 MED FILL — VIT D2 1.25 MG (50,000 UNIT: 1.25 MG | 28 days supply | Qty: 4 | Fill #2

## 2019-11-02 MED FILL — PHENTERMINE 37.5 MG TABLET: 37.5 | 30 days supply | Qty: 30 | Fill #1

## 2019-11-16 MED FILL — LEVOTHYROXINE SODIUM 112 MC: 112 | 30 days supply | Qty: 30 | Fill #3

## 2019-12-05 ENCOUNTER — Other Ambulatory Visit: Payer: Self-pay

## 2019-12-05 ENCOUNTER — Encounter: Payer: Self-pay | Admitting: Obstetrics & Gynecology

## 2019-12-05 ENCOUNTER — Ambulatory Visit (INDEPENDENT_AMBULATORY_CARE_PROVIDER_SITE_OTHER): Payer: 59 | Admitting: Obstetrics & Gynecology

## 2019-12-05 VITALS — Ht 64.5 in | Wt 228.0 lb

## 2019-12-05 DIAGNOSIS — Z6841 Body Mass Index (BMI) 40.0 and over, adult: Secondary | ICD-10-CM

## 2019-12-05 DIAGNOSIS — Z6838 Body mass index (BMI) 38.0-38.9, adult: Secondary | ICD-10-CM | POA: Diagnosis not present

## 2019-12-05 MED ORDER — PHENTERMINE HCL 37.5 MG PO TABS: ORAL_TABLET | ORAL | 1 refills | Status: DC

## 2019-12-05 MED FILL — PHENTERMINE 37.5 MG TABLET: 37.5 | 30 days supply | Qty: 30 | Fill #0

## 2019-12-05 NOTE — Progress Notes (Signed)
Virtual Visit via Telephone Note  I connected with Kathleen Mendez on 12/05/19 at  1:30 PM EST by telephone and verified that I am speaking with the correct person using two identifiers.   I discussed the limitations, risks, security and privacy concerns of performing an evaluation and management service by telephone and the availability of in person appointments. I also discussed with the patient that there may be a patient responsible charge related to this service. The patient expressed understanding and agreed to proceed. She was at home and I was in my office.  History of Present Illness:  Kathleen Mendez is a 39 y.o. who was started on Phentermine approximately 3 months ago due to obesity/abnormal weight gain. The patient has lost 10 pounds over the past 2 mos due to meds and diet..   She has these side effects: none.  Also has been on thyroid medicine.  PMHx: She  has a past medical history of Abnormal weight gain, Allergy, Anemia, Excessive or frequent menstruation, Human papillomavirus in conditions classified elsewhere and of unspecified site, Mastodynia, Morbid obesity ([place]), Other malaise and fatigue, and Sleep disturbance (05/03/2014). Also,  has a past surgical history that includes Cesarean section., family history includes Cancer in her mother and sister; Glaucoma in her father; Hyperlipidemia in her father; Hypertension in her father and sister; Lymphoma in her father; Osteoarthritis in her father; Seizures in her sister.,  reports that she has never smoked. She has never used smokeless tobacco. She reports that she does not drink alcohol or use drugs.  She has a current medication list which includes the following prescription(s): biotin, levothyroxine, phentermine, vitamin d, loratadine, and vitamin d (ergocalciferol). Also, has No Known Allergies.  Review of Systems  All other systems reviewed and are negative.   Observations/Objective: No exam today, due to telephone eVisit due to  [facility] virus restriction on elective visits and procedures.  Prior visits reviewed along with ultrasounds/labs as indicated. Home VS: Ht 5' 4.5" (1.638 m)   Wt 228 lb (103.4 kg)   BMI 38.53 kg/m   Assessment and Plan:   ICD-10-CM   1. Class 3 severe obesity due to excess calories without serious comorbidity with body mass index (BMI) of 40.0 to 44.9 in adult (HCC)  E66.01 phentermine (ADIPEX-P) 37.5 MG tablet   Z68.41   2. BMI 38.0-38.9,adult  Z68.38     Assessment: obesity Medication treatment is going well for her.  Plan: Patient is continued/added to prescription appetite suppressants: Phentermine.   Will continue to assist patient in incorporating positive experiences into her life to promote a positive mental attitude.  Education given regarding appropriate lifestyle changes for weight loss, including regular physical activity, healthy coping strategies, caloric restriction, and healthy eating patterns.  The risks and benefits as well as side effects of medication, such as Phenteramine or Tenuate, is discussed.  The pros and cons of suppressing appetite and boosting metabolism is counseled.  Risks of tolerance and addiction discussed.  Use of medicine will be short term.  Pt to call with any negative side effects and agrees to keep follow up appointments.  Follow Up Instructions: 2 mos   I discussed the assessment and treatment plan with the patient. The patient was provided an opportunity to ask questions and all were answered. The patient agreed with the plan and demonstrated an understanding of the instructions.   The patient was advised to call back or seek an in-person evaluation if the symptoms worsen or if the condition  fails to improve as anticipated.  I provided 12 minutes of non-face-to-face time during this encounter.   Hoyt Koch, MD

## 2019-12-15 MED FILL — LEVOTHYROXINE SODIUM 112 MC: 112 | 30 days supply | Qty: 30 | Fill #4

## 2020-01-17 MED FILL — PHENTERMINE 37.5 MG TABLET: 37.5 | 30 days supply | Qty: 30 | Fill #1

## 2020-01-17 MED FILL — LEVOTHYROXINE SODIUM 112 MC: 112 | 30 days supply | Qty: 30 | Fill #5

## 2020-02-02 ENCOUNTER — Other Ambulatory Visit: Payer: Self-pay

## 2020-02-02 ENCOUNTER — Telehealth: Payer: 59 | Admitting: Obstetrics & Gynecology

## 2020-02-02 ENCOUNTER — Telehealth (INDEPENDENT_AMBULATORY_CARE_PROVIDER_SITE_OTHER): Payer: 59 | Admitting: Obstetrics & Gynecology

## 2020-02-02 ENCOUNTER — Encounter: Payer: Self-pay | Admitting: Obstetrics & Gynecology

## 2020-02-02 DIAGNOSIS — Z713 Dietary counseling and surveillance: Secondary | ICD-10-CM | POA: Diagnosis not present

## 2020-02-02 DIAGNOSIS — Z6841 Body Mass Index (BMI) 40.0 and over, adult: Secondary | ICD-10-CM

## 2020-02-02 MED ORDER — PHENTERMINE HCL 37.5 MG PO TABS: ORAL_TABLET | ORAL | 1 refills | Status: DC

## 2020-02-02 NOTE — Progress Notes (Signed)
Virtual Visit via Video Note  I connected with Kathleen Mendez on 02/02/20 at 11:20 AM EDT by a video enabled telemedicine application and verified that I am speaking with the correct person using two identifiers.  Location: Patient: Home Provider: Office   I discussed the limitations of evaluation and management by telemedicine and the availability of in person appointments. The patient expressed understanding and agreed to proceed.  History of Present Illness: Kathleen Mendez is a 39 y.o. who was started on Phentermine approximately 5 months ago due to obesity/abnormal weight gain. The patient has lost 10 pounds over the past 2 mos due to meds and lifestyle changes..   She has these side effects: none.  PMHx: She  has a past medical history of Abnormal weight gain, Allergy, Anemia, Excessive or frequent menstruation, Human papillomavirus in conditions classified elsewhere and of unspecified site, Mastodynia, Morbid obesity ([place]), Other malaise and fatigue, and Sleep disturbance (05/03/2014). Also,  has a past surgical history that includes Cesarean section., family history includes Cancer in her mother and sister; Glaucoma in her father; Hyperlipidemia in her father; Hypertension in her father and sister; Lymphoma in her father; Osteoarthritis in her father; Seizures in her sister.,  reports that she has never smoked. She has never used smokeless tobacco. She reports that she does not drink alcohol or use drugs.  She has a current medication list which includes the following prescription(s): biotin, vitamin d, levothyroxine, loratadine, and phentermine. Also, has No Known Allergies.  Review of Systems  All other systems reviewed and are negative.   Observations/Objective: No exam today, due to telephone eVisit due to [facility] virus restriction on elective visits and procedures.  Prior visits reviewed along with ultrasounds/labs as indicated. From Home: Ht 5\' 4"  (1.626 m)   Wt 218 lb (98.9 kg)    BMI 37.42 kg/m  Body mass index is 37.42 kg/m.  Filed Weights   02/02/20 1120  Weight: 218 lb (98.9 kg)    Assessment and Plan:   ICD-10-CM   1. Class 3 severe obesity due to excess calories without serious comorbidity with body mass index (BMI) of 40.0 to 44.9 in adult (HCC)  E66.01 phentermine (ADIPEX-P) 37.5 MG tablet   Z68.41    Assessment: morbid obesity Medication treatment is going well for her.  Plan: Patient is continued/added to prescription appetite suppressants: Phentermine, self-directed dieting and exercise.   Will continue to assist patient in incorporating positive experiences into her life to promote a positive mental attitude.  Education given regarding appropriate lifestyle changes for weight loss, including regular physical activity, healthy coping strategies, caloric restriction, and healthy eating patterns.  The risks and benefits as well as side effects of medication, such as Phenteramine or Tenuate, is discussed.  The pros and cons of suppressing appetite and boosting metabolism is counseled.  Risks of tolerance and addiction discussed.  Use of medicine will be short term.  Pt to call with any negative side effects and agrees to keep follow up appointments.  Patient doing well with weight loss in progress. Will stop medicine after 2 more mos and allow for a drug-free holiday. Patient understands she may need future therapy if weight gain resumes or she does not reach her weight loss goals on her own with good diet and exercise habits.  Follow Up Instructions:    I discussed the assessment and treatment plan with the patient. The patient was provided an opportunity to ask questions and all were answered. The patient agreed with  the plan and demonstrated an understanding of the instructions.   The patient was advised to call back or seek an in-person evaluation if the symptoms worsen or if the condition fails to improve as anticipated.  A total of 20 minutes were  spent face-to-face with the patient as well as preparation, review, communication, and documentation during this encounter.   Barnett Applebaum, MD, [facility], [facility] 02/02/2020  11:34 AM

## 2020-02-16 MED FILL — LEVOTHYROXINE SODIUM 112 MC: 112 | 30 days supply | Qty: 30 | Fill #6

## 2020-02-17 MED FILL — PHENTERMINE 37.5 MG TABLET: 37.5 | 30 days supply | Qty: 30 | Fill #0

## 2020-03-01 ENCOUNTER — Ambulatory Visit: Payer: 59 | Admitting: Podiatry

## 2020-03-01 ENCOUNTER — Other Ambulatory Visit: Payer: Self-pay

## 2020-03-01 ENCOUNTER — Encounter: Payer: Self-pay | Admitting: Podiatry

## 2020-03-01 ENCOUNTER — Ambulatory Visit (INDEPENDENT_AMBULATORY_CARE_PROVIDER_SITE_OTHER): Payer: 59

## 2020-03-01 VITALS — Temp 97.9°F

## 2020-03-01 DIAGNOSIS — M722 Plantar fascial fibromatosis: Secondary | ICD-10-CM

## 2020-03-01 MED ORDER — DICLOFENAC SODIUM 75 MG PO TBEC: 75.0000 mg | DELAYED_RELEASE_TABLET | Freq: Two times a day (BID) | ORAL | 2 refills | Status: DC

## 2020-03-01 MED FILL — DICLOFENAC SOD EC 75 MG TAB: 75 | 25 days supply | Qty: 50 | Fill #0

## 2020-03-01 NOTE — Progress Notes (Signed)
Subjective:   Patient ID: Kathleen Mendez, female   DOB: 39 y.o.   MRN: [id number]   HPI patient presents stating she is had a lot of pain in the bottom of her right heel for several months and thinks maybe she had this before and has been trying topical cream shoe gear modifications and states it been hurting at least 2 months.  It was actually the left that hurt her before she is moderately obese and does not smoke likes to be active   Review of Systems  All other systems reviewed and are negative.       Objective:  Physical Exam Vitals and nursing note reviewed.  Constitutional:      Appearance: She is well-developed.  Pulmonary:     Effort: Pulmonary effort is normal.  Musculoskeletal:        General: Normal range of motion.  Skin:    General: Skin is warm.  Neurological:     Mental Status: She is alert.     Neurovascular status intact muscle strength adequate range of motion within normal limits.  Patient is noted to have exquisite discomfort right plantar fascia at the insertional point tendon calcaneus with inflammation fluid around the medial band and is noted to have moderate depression of the arch good digital perfusion     Assessment:  Acute plantar fasciitis right inflammation fluid around the medial band     Plan:  H&P condition reviewed sterile prep done injected the fascia 3 mg Kenalog 5 mg liken applied fascial brace placed on oral anti-inflammatory and gave instructions for supportive therapy and stretching.  Reappoint to recheck 2 weeks  X-rays indicate spur no indication stress fracture arthritis

## 2020-03-01 NOTE — Patient Instructions (Signed)

## 2020-03-16 ENCOUNTER — Other Ambulatory Visit: Payer: Self-pay

## 2020-03-16 ENCOUNTER — Encounter: Payer: Self-pay | Admitting: Podiatry

## 2020-03-16 ENCOUNTER — Ambulatory Visit: Payer: 59 | Admitting: Podiatry

## 2020-03-16 DIAGNOSIS — M722 Plantar fascial fibromatosis: Secondary | ICD-10-CM

## 2020-03-16 MED FILL — LEVOTHYROXINE SODIUM 112 MC: 112 | 30 days supply | Qty: 30 | Fill #7

## 2020-03-19 NOTE — Progress Notes (Signed)
Subjective:   Patient ID: Kathleen Mendez, female   DOB: 39 y.o.   MRN: [id number]   HPI Patient states overall she is doing a little bit better but she still has quite a bit of discomfort in her heels and states that the last injection seemed to give her some irritation of her skin and redness.  Patient is obese which is a complicating factor for this particular condition and works on cement type floors   ROS      Objective:  Physical Exam  Neurovascular status intact with inflammation pain of the plantar heel region bilateral with inflammation fluid around the medial band     Assessment:  Continuation of fasciitis-like condition bilateral     Plan:  H&P condition reviewed and at this point do not recommend injection but I did discuss the importance of physical therapy anti-inflammatories schedule this patient for orthotics and she is casted for functional orthotics today.  Patient will be seen back when returned

## 2020-03-30 MED FILL — DICLOFENAC SOD EC 75 MG TAB: 75 | 25 days supply | Qty: 50 | Fill #1

## 2020-04-06 ENCOUNTER — Other Ambulatory Visit: Payer: 59 | Admitting: Orthotics

## 2020-04-06 ENCOUNTER — Other Ambulatory Visit: Payer: Self-pay

## 2020-04-27 MED FILL — METHYLPREDNISOLONE 4 MG TBP: 4 | 6 days supply | Qty: 21 | Fill #0

## 2020-05-24 MED FILL — PHENTERMINE 37.5 MG TABLET: 37.5 | 30 days supply | Qty: 30 | Fill #1

## 2020-05-24 MED FILL — LEVOTHYROXINE SODIUM 112 MC: 112 | 30 days supply | Qty: 30 | Fill #9

## 2020-07-24 DIAGNOSIS — G4733 Obstructive sleep apnea (adult) (pediatric): Secondary | ICD-10-CM | POA: Diagnosis not present

## 2020-07-24 DIAGNOSIS — E7849 Other hyperlipidemia: Secondary | ICD-10-CM | POA: Diagnosis not present

## 2020-07-24 DIAGNOSIS — R079 Chest pain, unspecified: Secondary | ICD-10-CM | POA: Diagnosis not present

## 2020-07-24 DIAGNOSIS — D649 Anemia, unspecified: Secondary | ICD-10-CM | POA: Diagnosis not present

## 2020-07-25 DIAGNOSIS — Z20828 Contact with and (suspected) exposure to other viral communicable diseases: Secondary | ICD-10-CM | POA: Diagnosis not present

## 2020-08-06 ENCOUNTER — Other Ambulatory Visit: Payer: Self-pay | Admitting: *Deleted

## 2020-08-06 DIAGNOSIS — R079 Chest pain, unspecified: Secondary | ICD-10-CM

## 2020-08-06 NOTE — Progress Notes (Signed)
Pts echo is scheduled for 08/09/20 at 1115, scheduled by [facility].  Pt made aware of appt date and time by [facility] scheduling.

## 2020-08-06 NOTE — Progress Notes (Signed)
Echo ordered by Dr. Meda Coffee, to be done prior to seeing her in clinic on next Tuesday 08/14/20.  Echo is for chest pain, s/p first covid vaccine.  [facility] to schedule pts echo for this week and new patient appt with Dr. Meda Coffee on next Tuesday 08/14/20 at the 10 am end slot.    Pt is scheduled to see Dr. Meda Coffee in clinic on 10/5 at 10 am.  Dr. Meda Coffee to speak with Robyne Askew Supervisor tomorrow in doing her echo this week, prior to her OV with [place] on 10/5.

## 2020-08-07 ENCOUNTER — Other Ambulatory Visit: Payer: Self-pay

## 2020-08-07 ENCOUNTER — Ambulatory Visit (HOSPITAL_COMMUNITY): Payer: 59 | Attending: Cardiology

## 2020-08-07 DIAGNOSIS — R079 Chest pain, unspecified: Secondary | ICD-10-CM

## 2020-08-07 LAB — ECHOCARDIOGRAM COMPLETE
Area-P 1/2: 4.8 cm2
S' Lateral: 3.7 cm

## 2020-08-08 ENCOUNTER — Telehealth: Payer: Self-pay | Admitting: Cardiology

## 2020-08-08 ENCOUNTER — Telehealth: Payer: Self-pay | Admitting: *Deleted

## 2020-08-08 DIAGNOSIS — R0600 Dyspnea, unspecified: Secondary | ICD-10-CM

## 2020-08-08 DIAGNOSIS — R0602 Shortness of breath: Secondary | ICD-10-CM

## 2020-08-08 DIAGNOSIS — R0609 Other forms of dyspnea: Secondary | ICD-10-CM

## 2020-08-08 DIAGNOSIS — R931 Abnormal findings on diagnostic imaging of heart and coronary circulation: Secondary | ICD-10-CM

## 2020-08-08 NOTE — Telephone Encounter (Signed)
Kathleen Mendez     08/08/20 12:49 PM Note Message for patient to call and discuss preferred weekdays and times for scheduling the Cardiac MRI ordered by Dr. Meda Coffee

## 2020-08-08 NOTE — Telephone Encounter (Signed)
Dr. Meda Coffee made the pt aware of echo results and recommendation, for her to have a Cardiac MRI done, for noted abnormal echo, sob, and DOE.  Pt is aware that I will place the order in the system and send a message to our [facility] Schedulers to call her back and arrange this appt.  Pt verbalized understanding and agrees with this plan.

## 2020-08-08 NOTE — Telephone Encounter (Signed)
Message for patient to call and discuss preferred weekdays and times for scheduling the Cardiac MRI ordered by Dr. Meda Coffee

## 2020-08-09 ENCOUNTER — Other Ambulatory Visit (HOSPITAL_COMMUNITY): Payer: 59

## 2020-08-14 ENCOUNTER — Other Ambulatory Visit: Payer: Self-pay

## 2020-08-14 ENCOUNTER — Ambulatory Visit (INDEPENDENT_AMBULATORY_CARE_PROVIDER_SITE_OTHER): Payer: 59 | Admitting: Cardiology

## 2020-08-14 ENCOUNTER — Telehealth: Payer: Self-pay | Admitting: Cardiology

## 2020-08-14 ENCOUNTER — Encounter: Payer: Self-pay | Admitting: Cardiology

## 2020-08-14 VITALS — BP 126/74 | HR 95 | Ht 64.0 in | Wt 224.2 lb

## 2020-08-14 DIAGNOSIS — R079 Chest pain, unspecified: Secondary | ICD-10-CM

## 2020-08-14 NOTE — Patient Instructions (Signed)
Medication Instructions:   Your physician recommends that you continue on your current medications as directed. Please refer to the Current Medication list given to you today.  *If you need a refill on your cardiac medications before your next appointment, please call your pharmacy*   Testing/Procedures:  CARDIAC MRI WHICH WAS PREVIOUSLY ORDERED--OUR SCHEDULER WILL BE FOLLOWING UP WITH YOU TO ARRANGE THIS AND PROVIDE PRE-CERT INFORMATION AND COST.   Follow-Up:  WITH DR. Meda Coffee AS NEEDED

## 2020-08-14 NOTE — Progress Notes (Signed)
Cardiology Office Note:    Date:  08/14/2020   ID:  LUDA CHARBONNEAU, DOB 09-06-1981, MRN [id number]  PCP:  Velna Hatchet, MD  [facility] Cardiologist:  No primary care provider on file.  [facility] Electrophysiologist:  None   Referring MD: Sueanne Margarita, DO   Chief complaint: Chest pain  History of Present Illness:    Kathleen Mendez is a 39 y.o. female with no significant prior medical history other than transient hypothyroidism, who is coming for concern of chest pain that started after COVID-19 first vaccination dose.  Patient woke up the night after with severe pressure-like chest pain that lasted for 13 hours.  There were no fever chills cough.  This was on September 6.  Since then she has noticed that she is more short of breath mostly when she is talking and sometimes on moderate exertion and has right-sided pressure-like chest pain.  No palpitation no dizziness or syncope.  No lower extremity edema.  She has no prior cardiac history.  In her family there is no history of cardiomyopathy, CAD, or sudden cardiac death.  Her family history is significantly positive for presence of cancer, her mother died at age 54 from metastatic cancer that involved bone lung and liver, her sister died from same diagnosis at age of 70, her father died of metastatic cancer that involved brain and throat however primary tumor was not identified.  There was a concern that this can be related to environmental exposure at work.  Past Medical History:  Diagnosis Date  . Abnormal weight gain   . Allergy   . Anemia   . Chest pain   . Chest tightness   . Chronic sinusitis   . Excessive or frequent menstruation   . Excessive or frequent menstruation   . Human papillomavirus in conditions classified elsewhere and of unspecified site   . Hypothyroid   . Hypothyroidism   . Low back pain   . Mastodynia   . Menorrhagia   . Morbid obesity ([place])   . OSA (obstructive sleep apnea)   . Other fatigue     . Other malaise and fatigue   . Pericarditis    COVID VACCINE INDUCED  . Personal history of infectious disease   . Sleep disturbance 05/03/2014  . Streptococcal pharyngitis   . Thyrotoxicosis     Past Surgical History:  Procedure Laterality Date  . CESAREAN SECTION     x2    Current Medications: Current Meds  Medication Sig  . Cholecalciferol (VITAMIN D) 50 MCG (2000 UT) tablet   . Loratadine 10 MG CAPS      Allergies:   Other   Social History   Socioeconomic History  . Marital status: Single    Spouse name: Not on file  . Number of children: Not on file  . Years of education: Not on file  . Highest education level: Not on file  Occupational History  . Not on file  Tobacco Use  . Smoking status: Never Smoker  . Smokeless tobacco: Never Used  Vaping Use  . Vaping Use: Never used  Substance and Sexual Activity  . Alcohol use: No  . Drug use: No  . Sexual activity: Not Currently    Birth control/protection: Pill  Other Topics Concern  . Not on file  Social History Narrative  . Not on file   Social Determinants of Health   Financial Resource Strain:   . Difficulty of Paying Living Expenses: Not on file  Food Insecurity:   . Worried About Charity fundraiser in the Last Year: Not on file  . Ran Out of Food in the Last Year: Not on file  Transportation Needs:   . Lack of Transportation (Medical): Not on file  . Lack of Transportation (Non-Medical): Not on file  Physical Activity:   . Days of Exercise per Week: Not on file  . Minutes of Exercise per Session: Not on file  Stress:   . Feeling of Stress : Not on file  Social Connections:   . Frequency of Communication with Friends and Family: Not on file  . Frequency of Social Gatherings with Friends and Family: Not on file  . Attends Religious Services: Not on file  . Active Member of Clubs or Organizations: Not on file  . Attends Archivist Meetings: Not on file  . Marital Status: Not on file      Family History: The patient's family history includes Cancer in her mother and sister; Glaucoma in her father; Hyperlipidemia in her father; Hypertension in her father and sister; Lymphoma in her father; Osteoarthritis in her father; Seizures in her sister.  ROS:   Please see the history of present illness.    All other systems reviewed and are negative.  EKGs/Labs/Other Studies Reviewed:    The following studies were reviewed today:  EKG:  EKG is not ordered today.  The ekg her PCP office: Shows normal sinus rhythm, normal EKG.  Recent Labs: 10/03/2019: TSH 1.250  Recent Lipid Panel    Component Value Date/Time   CHOL 189 08/15/2019 0901   TRIG 120 08/15/2019 0901   HDL 46 08/15/2019 0901   CHOLHDL 4.1 08/15/2019 0901   LDLCALC 121 (H) 08/15/2019 0901    Physical Exam:    VS:  BP 126/74   Pulse 95   Ht 5\' 4"  (1.626 m)   Wt 224 lb 3.2 oz (101.7 kg)   SpO2 98%   BMI 38.48 kg/m     Wt Readings from Last 3 Encounters:  08/14/20 224 lb 3.2 oz (101.7 kg)  02/02/20 218 lb (98.9 kg)  12/05/19 228 lb (103.4 kg)    GEN:  Well nourished, well developed in no acute distress HEENT: Normal NECK: No JVD; No carotid bruits LYMPHATICS: No lymphadenopathy CARDIAC: RRR, no murmurs, rubs, gallops RESPIRATORY:  Clear to auscultation without rales, wheezing or rhonchi  ABDOMEN: Soft, non-tender, non-distended MUSCULOSKELETAL:  No edema; No deformity  SKIN: Warm and dry NEUROLOGIC:  Alert and oriented x 3 PSYCHIATRIC:  Normal affect   ASSESSMENT:    1. Chest pain of uncertain etiology    PLAN:    In order of problems listed above:  1. Post COVID-19 vaccination, suspicious for myopericarditis, her EKG is completely normal, we are now 4 weeks out of this event so testing for troponin would not be helpful.  I have reviewed her echocardiogram that shows LVEF of 55% and normal global longitudinal strain -19%.  Grade 1 diastolic dysfunction, no pericardial effusion.  No  significant valvular abnormalities. 2. I am suspicious that this might represent a case of myopericarditis and would recommend to proceed with cardiac MRI to evaluate for pericardial inflammation as well as myocardial involvement.  If those are confirmed we will proceed with specific therapies.     Medication Adjustments/Labs and Tests Ordered: Current medicines are reviewed at length with the patient today.  Concerns regarding medicines are outlined above.  No orders of the defined types were placed in  this encounter.  No orders of the defined types were placed in this encounter.   Patient Instructions  Medication Instructions:   Your physician recommends that you continue on your current medications as directed. Please refer to the Current Medication list given to you today.  *If you need a refill on your cardiac medications before your next appointment, please call your pharmacy*   Testing/Procedures:  CARDIAC MRI WHICH WAS PREVIOUSLY ORDERED--OUR SCHEDULER WILL BE FOLLOWING UP WITH YOU TO ARRANGE THIS AND PROVIDE PRE-CERT INFORMATION AND COST.   Follow-Up:  WITH DR. Meda Coffee AS NEEDED       Signed, Ena Dawley, MD  08/14/2020 10:53 AM    [place]

## 2020-08-14 NOTE — Telephone Encounter (Signed)
Sent patient a Secure Chat message regarding the appointment scheduled Tuesday 08/21/20 at 8:00 am at [facility] for the Cardiac MRI ordered by Dr. Aretha Parrot time is 7:30am 1st floor admissions office.

## 2020-08-14 NOTE — Telephone Encounter (Signed)
Pts Cardiac MRI is scheduled for next Tuesday 08/21/20 at 0800.  Pt made aware of appt date and time by MRI scheduler.

## 2020-08-20 ENCOUNTER — Telehealth (HOSPITAL_COMMUNITY): Payer: Self-pay | Admitting: Emergency Medicine

## 2020-08-20 NOTE — Telephone Encounter (Signed)
VM box full, unable to leave message.

## 2020-08-21 ENCOUNTER — Ambulatory Visit (HOSPITAL_COMMUNITY)
Admission: RE | Admit: 2020-08-21 | Discharge: 2020-08-21 | Disposition: A | Discharge status: Home / Self Care | Payer: 59 | Source: Ambulatory Visit | Attending: Cardiology | Admitting: Cardiology

## 2020-08-21 ENCOUNTER — Other Ambulatory Visit: Payer: Self-pay

## 2020-08-21 DIAGNOSIS — R0600 Dyspnea, unspecified: Secondary | ICD-10-CM | POA: Diagnosis not present

## 2020-08-21 DIAGNOSIS — R931 Abnormal findings on diagnostic imaging of heart and coronary circulation: Secondary | ICD-10-CM | POA: Diagnosis not present

## 2020-08-21 DIAGNOSIS — R0602 Shortness of breath: Secondary | ICD-10-CM | POA: Diagnosis not present

## 2020-08-21 DIAGNOSIS — R0609 Other forms of dyspnea: Secondary | ICD-10-CM

## 2020-08-21 MED ORDER — GADOBUTROL 1 MMOL/ML IV SOLN
10.0000 mL | Freq: Once | INTRAVENOUS | Status: AC | PRN
  Administered 2020-08-21: 10 mL via INTRAVENOUS

## 2020-08-23 ENCOUNTER — Telehealth: Payer: Self-pay | Admitting: *Deleted

## 2020-08-23 ENCOUNTER — Other Ambulatory Visit: Payer: Self-pay | Admitting: Cardiology

## 2020-08-23 DIAGNOSIS — R931 Abnormal findings on diagnostic imaging of heart and coronary circulation: Secondary | ICD-10-CM

## 2020-08-23 DIAGNOSIS — R079 Chest pain, unspecified: Secondary | ICD-10-CM

## 2020-08-23 DIAGNOSIS — R0602 Shortness of breath: Secondary | ICD-10-CM

## 2020-08-23 DIAGNOSIS — Z79899 Other long term (current) drug therapy: Secondary | ICD-10-CM

## 2020-08-23 DIAGNOSIS — R0600 Dyspnea, unspecified: Secondary | ICD-10-CM

## 2020-08-23 DIAGNOSIS — R0609 Other forms of dyspnea: Secondary | ICD-10-CM

## 2020-08-23 MED ORDER — LOSARTAN POTASSIUM 25 MG PO TABS: 25.0000 mg | ORAL_TABLET | Freq: Every day | ORAL | 1 refills | Status: DC

## 2020-08-23 MED FILL — LOSARTAN POTASSIUM 25 MG TA: 25 | 30 days supply | Qty: 30 | Fill #0

## 2020-08-23 NOTE — Telephone Encounter (Signed)
-----   Message from Dorothy Spark, MD sent at 08/23/2020 11:40 AM EDT ----- Please start losartan 25 mg po daily and check BMP in 4 weeks, thank you

## 2020-08-23 NOTE — Telephone Encounter (Signed)
Spoke with the pt and informed her of Cardiac MRI results and recommendations per Dr. Meda Coffee, for her to start taking losartan 25 mg po daily, and come in for a repeat BMET in 4 weeks.  Confirmed the pharmacy of choice with the pt. Pt request that I send in only a month supply of this medication, to make sure she tolerates appropriately.  We can kick to a 90 day supply thereafter, if tolerability met.  Scheduled her to come in for repeat lab to recheck a BMET in 4 weeks on, 09/20/20.  Pt verbalized understanding and agrees with this plan.

## 2020-09-06 ENCOUNTER — Encounter: Payer: Self-pay | Admitting: Allergy

## 2020-09-06 ENCOUNTER — Ambulatory Visit (INDEPENDENT_AMBULATORY_CARE_PROVIDER_SITE_OTHER): Payer: 59 | Admitting: Allergy

## 2020-09-06 ENCOUNTER — Other Ambulatory Visit: Payer: Self-pay

## 2020-09-06 VITALS — BP 132/90 | HR 79 | Temp 98.2°F | Resp 19 | Ht 63.0 in | Wt 222.4 lb

## 2020-09-06 DIAGNOSIS — T50B95A Adverse effect of other viral vaccines, initial encounter: Secondary | ICD-10-CM | POA: Diagnosis not present

## 2020-09-06 DIAGNOSIS — J3089 Other allergic rhinitis: Secondary | ICD-10-CM

## 2020-09-06 DIAGNOSIS — H1013 Acute atopic conjunctivitis, bilateral: Secondary | ICD-10-CM

## 2020-09-06 DIAGNOSIS — R21 Rash and other nonspecific skin eruption: Secondary | ICD-10-CM | POA: Diagnosis not present

## 2020-09-06 DIAGNOSIS — T50905D Adverse effect of unspecified drugs, medicaments and biological substances, subsequent encounter: Secondary | ICD-10-CM

## 2020-09-06 NOTE — Progress Notes (Signed)
New Patient Note  RE: Kathleen Mendez MRN: [id number] DOB: 1981-09-20 Date of Office Visit: 09/06/2020  Referring provider: No ref. provider found Primary care provider: Sueanne Margarita, DO  Chief Complaint: reactions  History of present illness: Kathleen Mendez is a 39 y.o. female presenting today for evaluation of allergic rhinitis and adverse medication events.    She states she has year-round allergy symptoms with watery eyes, sneezing, itchy nose and she alternates between claritin, zyrtec and allegra for symptom control.  She has not had allergy testing performed before.    She had dental work recently done that required numbing and she states her mouth became swollen and stayed swollen for about a week.  She states her mouth also looked red and oozed a purulent material.  She went back to the dentist for them to evaluate this and was told they were not going to do any more dental work until she had allergy testing.  She states she is not sure if she had to the numbing medication, the gloves material, the products used to sanitize the tools.  The dentist is at [facility] office.   She states she always has reactions to the flu vaccine even the egg free flu vaccine.  She states she will develop fluid filled blisters like you can see with a sunburn the following day after the vaccine and the blisters are present for about 4 weeks before than "dry up".  She does not treat the blisters with anything.     She had the first [place] vaccine on 07/16/20 dose and developed chest pain in the middle of night that night that woke up from sleep that lasted about 13 hours.  She also reports shortness of breath with it that has continued.  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    She states she had a reaction to [place] best eggs where she developed eye  swelling, hand swelling the next day.  She can eat eggs of other brands or farm fresh eggs without issue.    Review of systems in the past 4 weeks: Review of Systems  Constitutional: Negative.   HENT:       See HPI  Eyes:       See HPI  Respiratory: Positive for shortness of breath.   Cardiovascular: Negative.   Gastrointestinal: Negative.   Musculoskeletal: Negative.   Skin: Negative.   Neurological: Negative.     All other systems negative unless noted above in HPI  Past medical history: Past Medical History:  Diagnosis Date  . Abnormal weight gain   . Allergy   . Anemia   . Chest pain   . Chest tightness   . Chronic sinusitis   . Excessive or frequent menstruation   . Excessive or frequent menstruation   . Human papillomavirus in conditions classified elsewhere and of unspecified site   . Hypothyroid   . Hypothyroidism   . Low back pain   . Mastodynia   . Menorrhagia   . Morbid obesity ([place])   . OSA (obstructive sleep apnea)   . Other fatigue   . Other malaise and fatigue   . Pericarditis    COVID VACCINE INDUCED  . Personal history of infectious disease   . Sleep disturbance 05/03/2014  . Streptococcal pharyngitis   . Thyrotoxicosis     Past surgical history:  Past Surgical History:  Procedure Laterality Date  . CESAREAN SECTION     x2    Family history:  Family History  Problem Relation Age of Onset  . Cancer Mother        Lung  . Hypertension Father   . Osteoarthritis Father   . Hyperlipidemia Father   . Glaucoma Father   . Lymphoma Father   . Cancer Sister        Lung  . Hypertension Sister   . Seizures Sister     Social history: Lives in a home with electric heating and window cooling.  Dog in the home.  No concern for water damage, mildew or roaches in the home.  She is a CMA.  Denies a smoking history.    Medication List: Current Outpatient Medications  Medication Sig Dispense Refill  . Cholecalciferol (VITAMIN D) 50 MCG (2000 UT)  tablet     . Loratadine 10 MG CAPS     . losartan (COZAAR) 25 MG tablet Take 1 tablet (25 mg total) by mouth daily. 30 tablet 1   No current facility-administered medications for this visit.    Known medication allergies: No Active Allergies   Physical examination: Blood pressure 132/90, pulse 79, temperature 98.2 F (36.8 C), temperature source Temporal, resp. rate 19, height 5\' 3"  (1.6 m), weight 222 lb 6.4 oz (100.9 kg), SpO2 98 %.  General: Alert, interactive, in no acute distress. HEENT: PERRLA, TMs pearly gray, turbinates minimally edematous without discharge, post-pharynx non erythematous. Neck: Supple without lymphadenopathy. Lungs: Clear to auscultation without wheezing, rhonchi or rales. {no increased work of breathing. CV: Normal S1, S2 without murmurs. Abdomen: Nondistended, nontender. Skin: Warm and dry, without lesions or rashes. Extremities:  No clubbing, cyanosis or edema. Neuro:   Grossly intact.  Diagnositics/Labs:  Allergy testing: environmental allergy skin prick testing is positive to ky blue, meadow fescue, perennial rye, sweet vernal, timothy, hickory, maple, pecan, walnut, curvularia, pullulara, both dustmites, dog, mixed feathers, cockroach.   Allergy testing results were read and interpreted by provider, documented by clinical staff.   Assessment and plan:   Allergic rhinitis with conjunctivitis  - environmental allergy skin testing is positive to grass pollen, tree pollen, molds, Mendez mites, dog, mixed feathers (ie. Down feathers), cockroach.   - allergen avoidance measures provided  - continue use of long-acting antihsitamines like Allegra, Zyrtec and Xyzal daily as needed.  Can rotate these medications every 4-6 months  - for itchy/watery eyes can use over-the-counter Pataday or Pataday Xtra Strength 1 drop each eye daily as needed  - if medication management is not effective then can consider allergen immunotherapy (allergy shots)  Adverse  medication event Blistering of skin to vaccine Adverse vaccine reaction  - cardiac events after first Pfizer covid vaccine.  Discussed today that onset of symptoms and the symptoms she reported are not consistent with IgE mediated drug allergy to the [place] vaccine.  Do not believe that covid component vaccine testing would be beneficial.  Per cardiologist there is concern for myopericarditis and she is undergoing further work-up for this.  I would not recommend she further Pfizer vaccines due to complications she has developed.    - blisters after vaccination with flu vaccine.  Discussed today that onset of symptoms the next day and blisters are also not consistent with IgE mediated drug allergy to flu vaccine.  This would represent a delayed drug eruption.  I have ordered labs to assess for possible autoimmune skin issues that can  lead to blistering.  Again she would not benefit from IgE testing to flu vaccine or graded dose administration.  Discussed she does not have an egg allergy and she can tolerate ingestion of eggs (except for [place] best brand).  It is most likely that she had had adverse event to preservatives or sanitizing chemicals used on these particular eggs.    - adverse symptoms following dental procedure.  We will request records from your dentist to help identify what you were exposed to during this procedure.  We discussed option of anesthetic testing to rule-out this was a culprit of symptoms and anesthetic testing can also help to identify anesthetics that would be ok to use.  This is a form of IgE testing.  We also discussed option or patch testing to assess for contact dermatitis as there is possible concern for items in contact with oral cavity like gloves or sanitizing products.  Patch testing involves applying patch panels on your back which are left in place for 2 days. Patches are best applied on Mondays with return to office on Wednesday and Friday of same week for readings.   Will be able to make a plan once records are received.    Follow-up for further testing once dental records recieved   I appreciate the opportunity to take part in Lilith's care. Please do not hesitate to contact me with questions.  Sincerely,   Prudy Feeler, MD Allergy/Immunology [facility]

## 2020-09-06 NOTE — Patient Instructions (Addendum)
Allergic rhinitis with conjunctivitis  - environmental allergy skin testing is positive to grass pollen, tree pollen, molds, dust mites, dog, mixed feathers (ie. Down feathers), cockroach.   - allergen avoidance measures provided  - continue use of long-acting antihsitamines like Allegra, Zyrtec and Xyzal daily as needed.  Can rotate these medications every 4-6 months  - for itchy/watery eyes can use over-the-counter Pataday or Pataday Xtra Strength 1 drop each eye daily as needed  - if medication management is not effective then can consider allergen immunotherapy (allergy shots)  Adverse medication events  - cardiac events after first [place] covid vaccine.  Discussed today that onset of symptoms and the symptoms she reported are not consistent with IgE mediated drug allergy to the [place] vaccine.  Do not believe that covid component vaccine testing would be beneficial.  Per cardiologist there is concern for myopericarditis and she is undergoing further work-up for this.  I would not recommend she further Pfizer vaccines due to complications she has developed.    - blisters after vaccination with flu vaccine.  Discussed today that onset of symptoms the next day and blisters are also not consistent with IgE mediated drug allergy to flu vaccine.  This would represent a delayed drug eruption.  I have ordered labs to assess for possible autoimmune skin issues that can lead to blistering.  Again she would not benefit from IgE testing to flu vaccine or graded dose administration.  Discussed she does not have an egg allergy and she can tolerate ingestion of eggs (except for [place] best brand).  It is most likely that she had had adverse event to preservatives or sanitizing chemicals used on these particular eggs.    - adverse symptoms following dental procedure.  We will request records from your dentist to help identify what you were exposed to during this procedure.  We discussed option of anesthetic  testing to rule-out this was a culprit of symptoms and anesthetic testing can also help to identify anesthetics that would be ok to use.  This is a form of IgE testing.  We also discussed option or patch testing to assess for contact dermatitis as there is possible concern for items in contact with oral cavity like gloves or sanitizing products.  Patch testing involves applying patch panels on your back which are left in place for 2 days. Patches are best applied on Mondays with return to office on Wednesday and Friday of same week for readings.  Will be able to make a plan once records are received.    Follow-up for further testing once dental records recieved

## 2020-09-07 NOTE — Addendum Note (Signed)
Addended by: Farrel Demark R on: 09/07/2020 11:44 AM   Modules accepted: Orders

## 2020-09-08 LAB — SEDIMENTATION RATE: Sed Rate: 32 mm/hr (ref 0–32)

## 2020-09-08 LAB — ANTISKIN AUTOANTIBODIES, QUANT: Pemphigus: NEGATIVE

## 2020-09-08 LAB — ANA W/REFLEX: Anti Nuclear Antibody (ANA): NEGATIVE

## 2020-09-20 ENCOUNTER — Telehealth: Payer: Self-pay | Admitting: *Deleted

## 2020-09-20 ENCOUNTER — Other Ambulatory Visit: Payer: 59 | Admitting: *Deleted

## 2020-09-20 ENCOUNTER — Other Ambulatory Visit: Payer: Self-pay

## 2020-09-20 DIAGNOSIS — R0600 Dyspnea, unspecified: Secondary | ICD-10-CM

## 2020-09-20 DIAGNOSIS — Z79899 Other long term (current) drug therapy: Secondary | ICD-10-CM

## 2020-09-20 DIAGNOSIS — R0609 Other forms of dyspnea: Secondary | ICD-10-CM

## 2020-09-20 DIAGNOSIS — R0602 Shortness of breath: Secondary | ICD-10-CM

## 2020-09-20 DIAGNOSIS — R931 Abnormal findings on diagnostic imaging of heart and coronary circulation: Secondary | ICD-10-CM | POA: Diagnosis not present

## 2020-09-20 DIAGNOSIS — R079 Chest pain, unspecified: Secondary | ICD-10-CM | POA: Diagnosis not present

## 2020-09-20 LAB — BASIC METABOLIC PANEL
BUN/Creatinine Ratio: 20 (ref 9–23)
BUN: 12 mg/dL (ref 6–20)
CO2: 27 mmol/L (ref 20–29)
Calcium: 10.1 mg/dL (ref 8.7–10.2)
Chloride: 100 mmol/L (ref 96–106)
Creatinine, Ser: 0.61 mg/dL (ref 0.57–1.00)
GFR calc Af Amer: 134 mL/min/{1.73_m2} (ref >59)
GFR calc non Af Amer: 116 mL/min/{1.73_m2} (ref >59)
Glucose: 117 mg/dL — ABNORMAL HIGH (ref 65–99)
Potassium: 4.2 mmol/L (ref 3.5–5.2)
Sodium: 140 mmol/L (ref 134–144)

## 2020-09-20 NOTE — Telephone Encounter (Signed)
Attempted to call patient to inform. There was no answer and the voicemail was full. Will need to call again.    Routing comment   You 1 minute ago (11:46 AM)     ----- Message from Kennith Gain, MD sent at 09/20/2020  9:26 AM EST ----- Regarding: FW: testing Please tell pt the below message.    Also received call back from dentist and they can provide her with a sample of the topical anesthetic used.  The nurse there said she will have sample for her to pick up.  Please let pt know she can go by dentist to get this.    Please schedule for anesthetic skin testing and patch placement.   ----- Message ----- From: Kennith Gain, MD Sent: 09/20/2020   9:02 AM EST To: [facility] Subject: testing                                        Please let patient know that received information from her dentist.  Can proceed with anesthetic skin testing however it appears she also got a topical anesthetic as well and believe she would benefit from patch testing to this product.  We do not have any topical anesthetic products in the office thus I have reached out to the dentist to see if they can provide her with a small sample and to bring in with her.  I will provide more information once I hear back from the dentist if they are able to provide this.

## 2020-09-20 NOTE — Telephone Encounter (Signed)
-----   Message from Beatty, MD sent at 09/20/2020  9:26 AM EST ----- Regarding: FW: testing Please tell pt the below message.    Also received call back from dentist and they can provide her with a sample of the topical anesthetic used.  The nurse there said she will have sample for her to pick up.  Please let pt know she can go by dentist to get this.    Please schedule for anesthetic skin testing and patch placement.   ----- Message ----- From: Kennith Gain, MD Sent: 09/20/2020   9:02 AM EST To: [facility] Subject: testing                                        Please let patient know that received information from her dentist.  Can proceed with anesthetic skin testing however it appears she also got a topical anesthetic as well and believe she would benefit from patch testing to this product.  We do not have any topical anesthetic products in the office thus I have reached out to the dentist to see if they can provide her with a small sample and to bring in with her.  I will provide more information once I hear back from the dentist if they are able to provide this.

## 2020-09-21 ENCOUNTER — Encounter: Payer: Self-pay | Admitting: *Deleted

## 2020-09-21 MED FILL — LOSARTAN POTASSIUM 25 MG TA: 25 | 30 days supply | Qty: 30 | Fill #1

## 2020-09-21 NOTE — Telephone Encounter (Signed)
Attempted to call patient again. Voicemail was full was unable to leave a voicemail. Sent a MyChart message advising of Dr. Jeralyn Ruths message and asked patient to return call to have her schedule an appointment for anesthetic skin testing.

## 2020-09-21 NOTE — Telephone Encounter (Signed)
Patient received MyChart message and will call to schedule and go to the dentist to pick up sample.

## 2020-09-24 ENCOUNTER — Telehealth: Payer: Self-pay | Admitting: *Deleted

## 2020-09-24 ENCOUNTER — Other Ambulatory Visit: Payer: Self-pay | Admitting: Cardiology

## 2020-09-24 DIAGNOSIS — Z79899 Other long term (current) drug therapy: Secondary | ICD-10-CM

## 2020-09-24 DIAGNOSIS — R079 Chest pain, unspecified: Secondary | ICD-10-CM

## 2020-09-24 DIAGNOSIS — R0600 Dyspnea, unspecified: Secondary | ICD-10-CM

## 2020-09-24 DIAGNOSIS — R931 Abnormal findings on diagnostic imaging of heart and coronary circulation: Secondary | ICD-10-CM

## 2020-09-24 DIAGNOSIS — R0602 Shortness of breath: Secondary | ICD-10-CM

## 2020-09-24 DIAGNOSIS — R0609 Other forms of dyspnea: Secondary | ICD-10-CM

## 2020-09-24 MED ORDER — LOSARTAN POTASSIUM 25 MG PO TABS: 25.0000 mg | ORAL_TABLET | Freq: Every day | ORAL | 2 refills | Status: DC

## 2020-09-24 NOTE — Telephone Encounter (Signed)
-----   Message from Dorothy Spark, MD sent at 09/21/2020  3:33 PM EST ----- Normal electrolytes and crea, mildly elevated blood glucose

## 2020-09-24 NOTE — Telephone Encounter (Signed)
The patient has been notified of the result and verbalized understanding.  All questions (if any) were answered. Nuala Alpha, LPN 21/09/5519 8:02 AM    Sent in the pts losartan refill for 90 day supply, as she requested. Confirmed the pharmacy of choice with the pt. Pt verbalized understanding and agrees with this plan.

## 2020-10-11 ENCOUNTER — Telehealth: Payer: Self-pay

## 2020-10-11 NOTE — Telephone Encounter (Signed)
Patient called to give Dr Nelva Bush an update regarding her picking up the medication from the Dentist. Patient states her schedule has been crazy and each time she tries to go by the dentist they have been closed. Patient states she is going to work something out and get an appt scheduled for the challenge.  Patient is also wondering what to do regarding her COVID Vaccine? She wants to know if she should try another brand or just hold off?  Please advise.

## 2020-10-11 NOTE — Telephone Encounter (Signed)
Thanks for the update.  I would not advised any further mRNA Covid vaccines at this time given severity of non-IgE mediated symptoms she developed wit [place].  It is likely she could receive the Johnson&Johnson vaccine as a booster.

## 2020-10-11 NOTE — Telephone Encounter (Signed)
Patient is wanting to confirm that you know she has only received 1 injection of the Pfizer vaccine. She is also asking if we could give her the "[employer] booster vaccine."

## 2020-10-12 ENCOUNTER — Telehealth: Payer: Self-pay | Admitting: *Deleted

## 2020-10-12 NOTE — Telephone Encounter (Signed)
Kennith Gain, MD to [place]     12:03 PM Note Yes I am aware. We do have access to [employer] vaccine for administration in the office if that is something she would like to pursue.  However would recommend she discuss with her cardiologist as well prior to administration.    October 11, 2020 Donzetta Starch, CMA to Kennith Gain, MD     4:59 PM Note Patient is wanting to confirm that you know she has only received 1 injection of the [place] vaccine. She is also asking if we could give her the "[employer] booster vaccine."    Nelva Bush, Rae Halsted, MD to [place]     3:10 PM Note Thanks for the update.  I would not advised any further mRNA Covid vaccines at this time given severity of non-IgE mediated symptoms she developed wit [place].  It is likely she could receive the Johnson&Johnson vaccine as a booster.

## 2020-10-12 NOTE — Telephone Encounter (Signed)
Yes I am aware. We do have access to [employer] vaccine for administration in the office if that is something she would like to pursue.  However would recommend she discuss with her cardiologist as well prior to administration.

## 2020-10-12 NOTE — Telephone Encounter (Signed)
Left detailed message on patient's voicemail per DPR.

## 2020-10-12 NOTE — Telephone Encounter (Signed)
Dr.  Meda Coffee copied in this message and in red is a note from pts allergist about potentially getting her scheduled to get the J&J Vaccine. Dr. Nelva Bush will be needing clearance from you from a cardiac perspective, prior to administering this vaccine.  Please advise if the pt is cleared from a cardiac perspective to proceed with getting J&J vaccine by her Allergist.  Thanks!

## 2020-10-16 NOTE — Telephone Encounter (Signed)
Yes, she should get Wynetta Emery and [place] vaccine this time. KN

## 2020-10-17 NOTE — Telephone Encounter (Signed)
Pt aware that per Dr. Meda Coffee, she may proceed with getting the covid vaccine, and she recommends that she is clear from a cardiac perspective to receive Wynetta Emery and [employer] vaccine only.  Pt verbalized understanding and agrees with this plan. Will send this message to the pts Allergist Dr. Nelva Bush via epic, to make her aware of this.

## 2020-10-17 NOTE — Telephone Encounter (Signed)
Ok for her to receive J&J Covid vaccine for booster purposes per her cardiologist.

## 2020-10-18 NOTE — Telephone Encounter (Signed)
Called and left a voicemail asking for patient to return call to discuss flu vaccine.

## 2020-10-18 NOTE — Telephone Encounter (Signed)
Patient has been placed on list to call once we open up another vaccine day this month.   Patient is also wanting Dr Julaine Hua advice on getting the Flu Vaccine.   Please advise

## 2020-10-18 NOTE — Telephone Encounter (Signed)
She reported developing a blistering rash that last for weeks after she has received the flu vaccine in the past.  Unfortunately like the Covid vaccine reaction she had, her symptoms following the flu vaccine is not an IgE mediated process.  Thus I do not have a "safe" way to administer the flu vaccine without the development of the blistering rash.  Thus would not recommend flu vaccine in this case for her.

## 2020-10-19 ENCOUNTER — Telehealth: Payer: Self-pay

## 2020-10-19 NOTE — Telephone Encounter (Signed)
Letter composed and signed by MD. I have let the patient know that it would be available for pick up 10/19/20 until 1130 or Monday morning via my chart. [place], LPN is printing it in the [facility] office for Dr. Nelva Bush to sign.

## 2020-10-19 NOTE — Telephone Encounter (Signed)
-----   Message from [place], MD sent at 10/18/2020  4:16 PM EST ----- Regarding: letter Please create a letter as follows:   Dear [facility]:  Kathleen Mendez is under my care at the [facility].  I have recommended that she not receive the influenza vaccine as she has developed non-IgE mediated symptoms with previous influenza vaccine administrations.    In regards to Covid vaccination she is set to receive the [employer] vaccine at an upcoming vaccine clinic administration day in our [place] office to be scheduled in January 2022.  Please provide her with an extension for full vaccination status until we are able to provide this to her in our office.  Feel free to call the office with any questions.  Sincerely,     Prudy Feeler, MD [facility]

## 2020-10-22 MED FILL — LOSARTAN POTASSIUM 25 MG TA: 25 | 90 days supply | Qty: 90 | Fill #0

## 2020-10-29 NOTE — Telephone Encounter (Addendum)
Patient called and states that she needs forms filled out again and they need to be completed by 12/22. New forms were faxed to [facility] and have been faxed to [facility] for Dr. Nelva Bush on Tuesday. Patient would like to know if we can directly fax the forms due to the short notice. Patient informed that Dr. Nelva Bush would not be in the office until Tuesday.  Please advise.

## 2020-10-29 NOTE — Telephone Encounter (Signed)
Jarrett Soho, once Dr. Nelva Bush completed these do you mind faxing them. Please and thank you.

## 2020-10-30 NOTE — Telephone Encounter (Signed)
I received the forms today.  I will do my best to complete them but not sure they will be ready by tomorrow.  Did anyone inform her it can take up to 5 business days for form completion?

## 2020-10-30 NOTE — Telephone Encounter (Signed)
Patient called to check on the status of her forms since they are due tomorrow. Patient informed that Dr. Nelva Bush had the forms and we will call her when they are completed.

## 2020-10-30 NOTE — Telephone Encounter (Signed)
Called patient and left a detailed voicemail advising what was stated by Dr. Nelva Bush. I did advise that we would call her tomorrow with an update on her forms.

## 2020-10-31 NOTE — Telephone Encounter (Signed)
Called and spoke with patient and she will come and pick them up today in the [place] office. Copies have been made and placed in bulk scanning. Originals have been placed up front in the [place] office for pickup.

## 2020-10-31 NOTE — Telephone Encounter (Signed)
Forms have been completed.

## 2020-11-16 ENCOUNTER — Telehealth: Payer: Self-pay | Admitting: Allergy

## 2020-11-16 NOTE — Telephone Encounter (Signed)
PT called schedule to get J&J booster on monday 1/10. PT was covid positive and [facility] has her in quarantine until sunday 1/09 which is day 5 for her. She wants to know if she can get the booster this close after having a covid positive dx? PT had no symptoms other than tiredness.

## 2020-11-16 NOTE — Telephone Encounter (Signed)
Patient stated she will wait until Monday to see if she has any symptoms and will contact us to let us know if she needs to reschedule.

## 2020-11-16 NOTE — Telephone Encounter (Signed)
It is recommended that she can get [employer] vaccine at any time if she is asymptomatic and has completed her quarantine/isolation... Thus if she is asymptomatic on Monday and is out of her quarantine then she can receive the dose then. If she is still symptomatic then she should wait until she is asymptomatic to do so. Thus if she would like to receive the vaccine on 11/19/2020 that is fine as long as she is not having any symptoms otherwise the next scheduled vaccine clinic I believe is 12/10/2020.

## 2020-11-19 ENCOUNTER — Ambulatory Visit: Payer: Self-pay

## 2020-12-07 ENCOUNTER — Other Ambulatory Visit: Payer: Self-pay | Admitting: Allergy

## 2020-12-07 DIAGNOSIS — G4733 Obstructive sleep apnea (adult) (pediatric): Secondary | ICD-10-CM | POA: Diagnosis not present

## 2020-12-07 DIAGNOSIS — Z7189 Other specified counseling: Secondary | ICD-10-CM | POA: Diagnosis not present

## 2020-12-07 DIAGNOSIS — R030 Elevated blood-pressure reading, without diagnosis of hypertension: Secondary | ICD-10-CM | POA: Diagnosis not present

## 2020-12-07 DIAGNOSIS — D649 Anemia, unspecified: Secondary | ICD-10-CM | POA: Diagnosis not present

## 2020-12-07 DIAGNOSIS — E559 Vitamin D deficiency, unspecified: Secondary | ICD-10-CM | POA: Diagnosis not present

## 2020-12-07 DIAGNOSIS — E785 Hyperlipidemia, unspecified: Secondary | ICD-10-CM | POA: Diagnosis not present

## 2020-12-07 DIAGNOSIS — E039 Hypothyroidism, unspecified: Secondary | ICD-10-CM | POA: Diagnosis not present

## 2020-12-07 DIAGNOSIS — R5383 Other fatigue: Secondary | ICD-10-CM | POA: Diagnosis not present

## 2020-12-07 MED ORDER — AZELASTINE HCL 0.1 % NA SOLN: 2.0000 | Freq: Two times a day (BID) | NASAL | 5 refills | Status: DC

## 2020-12-07 MED FILL — AZELASTINE HCL 137 MCG SPRY: 0.1 | 25 days supply | Qty: 30 | Fill #0

## 2020-12-07 NOTE — Addendum Note (Signed)
Addended by: Valere Dross on: 12/07/2020 04:18 PM   Modules accepted: Orders

## 2020-12-07 NOTE — Telephone Encounter (Signed)
Azelastine sent to [facility] outpatient pharmacy. Patient is going to cancel her J&J appt on this coming Monday and is going to plan on coming in on 12/31/20 at 11:30 AM to get her J&J vaccine with [facility].

## 2020-12-07 NOTE — Telephone Encounter (Signed)
Patient states that she was told it may be too soon to get the J&J booster being so close to her positive COVID test. Patient states she still has a cough and post nasal drip. Patient wants to make sure Dr. Nelva Bush is 997% certain that it is okay for her to get the booster this soon to having COVID and with those symptoms.  Please advise.

## 2020-12-07 NOTE — Telephone Encounter (Signed)
Yes she can get the J&J vaccine after having Covid as long as she has completed her isolation period which she has.  It would also be best if she was asymptomatic or at least improved symptoms.   Discussed with Eustaquio Maize and agreed if she can come for the next vaccine clinic on 12/31/20 can provide the J&J vaccine then.  That will allow more time for symptoms to continue to improve and hopefully resolve.   If she would like a prescription for nasal Astelin we can send that which would help with post nasal drip and subsequently should help with cough if being driven for the drip.

## 2020-12-13 ENCOUNTER — Other Ambulatory Visit (HOSPITAL_COMMUNITY): Payer: Self-pay | Admitting: Internal Medicine

## 2020-12-13 MED FILL — LEVOTHYROXINE 50 MCG TABLET: 50 | 90 days supply | Qty: 90 | Fill #0

## 2020-12-31 ENCOUNTER — Ambulatory Visit (INDEPENDENT_AMBULATORY_CARE_PROVIDER_SITE_OTHER): Payer: 59

## 2020-12-31 ENCOUNTER — Other Ambulatory Visit: Payer: Self-pay

## 2020-12-31 DIAGNOSIS — Z23 Encounter for immunization: Secondary | ICD-10-CM

## 2020-12-31 NOTE — Progress Notes (Signed)
   Covid-19 Vaccination Clinic  Name:  Kathleen Mendez    MRN: [id number] DOB: 1981/07/09  12/31/2020  [name] was observed post Covid-19 immunization for 15 minutes without incident. She was provided with Vaccine Information Sheet and instruction to access the V-Safe system.   [name] was instructed to call 911 with any severe reactions post vaccine: Marland Kitchen Difficulty breathing  . Swelling of face and throat  . A fast heartbeat  . A bad rash all over body  . Dizziness and weakness   Immunizations Administered    Name Date Dose VIS Date Route   JANSSEN COVID-19 VACCINE 12/31/2020 11:50 AM 0.5 mL 08/29/2020 Intramuscular   Manufacturer: [employer]   Lot: [id number]   [place]: [phone]

## 2020-12-31 NOTE — Telephone Encounter (Signed)
Patient dropped off samples from dentist in December and has been waiting for a call from our office to schedule an appointment. Patient informed that Dr. Nelva Bush will review samples and we will give her a call on next steps.  Samples are in Dr. Jeralyn Ruths office on shelf.

## 2021-01-28 ENCOUNTER — Encounter: Payer: 59 | Admitting: Family

## 2021-02-14 ENCOUNTER — Other Ambulatory Visit: Payer: Self-pay

## 2021-02-14 ENCOUNTER — Ambulatory Visit: Payer: 59 | Admitting: Family Medicine

## 2021-02-14 ENCOUNTER — Encounter: Payer: Self-pay | Admitting: Family Medicine

## 2021-02-14 VITALS — BP 134/80 | HR 85 | Temp 98.2°F | Resp 20

## 2021-02-14 DIAGNOSIS — T50995A Adverse effect of other drugs, medicaments and biological substances, initial encounter: Secondary | ICD-10-CM | POA: Diagnosis not present

## 2021-02-14 DIAGNOSIS — R0602 Shortness of breath: Secondary | ICD-10-CM | POA: Diagnosis not present

## 2021-02-14 DIAGNOSIS — T50B95A Adverse effect of other viral vaccines, initial encounter: Secondary | ICD-10-CM

## 2021-02-14 DIAGNOSIS — T50905D Adverse effect of unspecified drugs, medicaments and biological substances, subsequent encounter: Secondary | ICD-10-CM | POA: Diagnosis not present

## 2021-02-14 NOTE — Progress Notes (Addendum)
[place] [address] Dept: [phone]  FOLLOW UP NOTE  Patient ID: Kathleen Mendez, female    DOB: [birth year]  Age: 40 y.o. MRN: [id number] Date of Office Visit: 02/14/2021  Assessment  Chief Complaint: Food/Drug Challenge  HPI Kathleen Mendez is a 40 year old female who presents to the clinic for an in office local anesthetic skin test. At today's visit, she reports she is feeling well overall. She denies cardiopulmonary, integumentary, and gastrointestinal issues. She has not taken an antihistamine for the last 4 days. She does report thick post nasal drainage after stopping her antihistamine. We will proceed with skin testing to three topical anesthetic agents including citanest 4% which her dentist's office has provided, as well as lidocaine 2%, and mepivacaine 3%. Her current medications are listed in the chart.    Drug Allergies:  No Known Allergies  Physical Exam: BP 134/80   Pulse 85   Temp 98.2 F (36.8 C) (Temporal)   Resp 20   SpO2 97%    Physical Exam Vitals reviewed.  Constitutional:      Appearance: Normal appearance.  HENT:     Head: Normocephalic and atraumatic.     Right Ear: Tympanic membrane normal.     Left Ear: Tympanic membrane normal.     Nose:     Comments: Bilateral nares slightly erythematous with clear nasal drainage noted. Pharynx slightly erythematous with no exudate. Ears normal. Eyes normal.    Mouth/Throat:     Pharynx: Oropharynx is clear.  Eyes:     Conjunctiva/sclera: Conjunctivae normal.  Cardiovascular:     Rate and Rhythm: Normal rate and regular rhythm.     Heart sounds: Normal heart sounds. No murmur heard.   Pulmonary:     Effort: Pulmonary effort is normal.     Breath sounds: Normal breath sounds.     Comments: Lungs clear to auscultation Musculoskeletal:        General: Normal range of motion.     Cervical back: Normal range of motion and neck supple.  Skin:    General: Skin is warm and dry.   Neurological:     Mental Status: She is alert and oriented to person, place, and time.  Psychiatric:        Mood and Affect: Mood normal.        Behavior: Behavior normal.        Thought Content: Thought content normal.        Judgment: Judgment normal.    Procedure note: Written consent obtained Citanest 4% (40 mg/ml): Skin testing to citanest 4% was tolerated at the level of skin prick testing, 1/100 intradermal injection, and 1/10 intradermal injection.  Patient developed redness equal to the histamine control at the full strength intradermal level and testing for citanest 4% was stopped at that time.  Mepivacaine Hcl 3% (30 mg/ml): Patient tolerated skin testing to mepivacaine Hcl 3% at all levels of skin testing including: skin prick testing, 1/100 intradermal injection, 1/10 intradermal injection, full strength intradermal injection, 0.1 cc full strength subcutaneous injection, 0.5 cc full strength subcutaneous injection, and 1.0 cc full strength subucataneous injection without adverse reaction.  Lidocaine 2% (40mg .ml):  Patient tolerated skin testing to lidocaine 2% at all levels of skin testing including: skin prick testing, 1/100 intradermal injection, 1/10 intradermal injection, full strength intradermal injection, 0.1 cc full strength subcutaneous injection, 0.5 cc full strength subcutaneous injection, and 1.0 cc full strength subucataneous injection without adverse reaction.  Assessment and Plan: 1. Adverse effect of drug, subsequent encounter     Patient Instructions  Topical anesthetic testing You were not able to tolerate the citanest 4% (40 mg/ml) at today's visit. Continue to avoid citanest 4% You were able to tolerate mepivacaine Hcl 3% (30 mg/ml) and lidocaine 2% (40 mg/2 ml) with no adverse reactions. - Monitor for allergic symptoms such as rash, wheezing, diarrhea, swelling, and vomiting for the next 24 hours. If severe symptoms occur, call 911. For less severe  symptoms treat with Benadryl 50 mg once every 4 hours and call the clinic.   Patch testing to numbing gel Patch has been placed at today's visit. Do not get the patch wet. Remove the patch on Saturday. Wait 10 minutes and then take a picture of the area where the patch was. Call the clinic with a positive or red, itchy result. Follow up in the clinic on Monday for the final patch reading.   Call the clinic if this treatment plan is not working well for you  Follow up on Monday (4 days) or sooner if needed.   Return in about 4 days (around 02/18/2021), or if symptoms worsen or fail to improve.    Thank you for the opportunity to care for this patient.  Please do not hesitate to contact me with questions.  Gareth Morgan, FNP [facility] [place]

## 2021-02-14 NOTE — Patient Instructions (Addendum)
Topical anesthetic testing You were not able to tolerate the citanest 4% (40 mg/ml) at today's visit. Continue to avoid citanest 4% You were able to tolerate mepivacaine Hcl 3% (30 mg/ml) and lidocaine 2% (40 mg/2 ml) with no adverse reactions. - Monitor for allergic symptoms such as rash, wheezing, diarrhea, swelling, and vomiting for the next 24 hours. If severe symptoms occur, call 911. For less severe symptoms treat with Benadryl 50 mg once every 4 hours and call the clinic.   Patch testing to numbing gel Patch has been placed at today's visit. Do not get the patch wet. Remove the patch on Saturday. Wait 10 minutes and then take a picture of the area where the patch was. Call the clinic with a positive or red, itchy result. Follow up in the clinic on Monday for the final patch reading.   Call the clinic if this treatment plan is not working well for you  Follow up on Monday (4 days) or sooner if needed.

## 2021-02-15 ENCOUNTER — Telehealth: Payer: Self-pay

## 2021-02-15 NOTE — Telephone Encounter (Signed)
Kathleen Hoyer, FNP  [facility] Can you please call this patient to make an appointment for a patch reading on Monday? Thank you    Tried calling pt but voicemail box is full

## 2021-02-15 NOTE — Progress Notes (Signed)
Cardiology Office Note:    Date:  02/21/2021   ID:  Camelle, Henkels [birth year], MRN [id number]  PCP:  Sueanne Margarita, [place]  Cardiologist:  No primary care provider on file.  Advanced Practice Provider:  No care team member to display Electrophysiologist:  None   :[id number]}   Referring MD: Sueanne Margarita, DO    History of Present Illness:    Kathleen Mendez is a 40 y.o. female with a hx of OSA and recently diagnosed LVNC on cardiac MRI who was previously followed by Dr. Meda Coffee for chest pain after receiving the COVID vaccine who now returns to clinic for follow-up.  Last saw Dr. Meda Coffee on 08/14/20 where she noted severe chest pressure after COVID vaccine. TTE was performed which revealed LVEF 50-55%, no significant valve abnormalities. Recommended for cardiac MRI given concern for myocarditis where she was found to have LVNC. She was started on losartan 25mg  daily.  The patient states that she overall feels well. No chest pain. Has been tolerating losartan 25mg  daily. No shortness, orthopnea, or PND. Patient is active and on her feet all day and no symptoms. No lightheadedness, dizziness or palpitations. Eats very healthy [facility] (ellipitcal, bikes, walks). She is unsure why she is unable to lose weight despite the lifestyle changes she has made.  No family history of cardiac disease or SCD. No first degree relatives are currently living. Has a daughter who is 19 and a son who is 51.   Past Medical History:  Diagnosis Date  . Abnormal weight gain   . Allergy   . Anemia   . Chest pain   . Chest tightness   . Chronic sinusitis   . Excessive or frequent menstruation   . Excessive or frequent menstruation   . Human papillomavirus in conditions classified elsewhere and of unspecified site   . Hypothyroid   . Hypothyroidism   . Low back pain   . Mastodynia   . Menorrhagia   . Morbid obesity ([place])   . OSA  (obstructive sleep apnea)   . Other fatigue   . Other malaise and fatigue   . Pericarditis    COVID VACCINE INDUCED  . Personal history of infectious disease   . Sleep disturbance 05/03/2014  . Streptococcal pharyngitis   . Thyrotoxicosis     Past Surgical History:  Procedure Laterality Date  . CESAREAN SECTION     x2    Current Medications: No outpatient medications have been marked as taking for the 02/21/21 encounter (Office Visit) with Freada Bergeron, MD.     Allergies:   Patient has no known allergies.   Social History   Socioeconomic History  . Marital status: Single    Spouse name: Not on file  . Number of children: Not on file  . Years of education: Not on file  . Highest education level: Not on file  Occupational History  . Not on file  Tobacco Use  . Smoking status: Never Smoker  . Smokeless tobacco: Never Used  Vaping Use  . Vaping Use: Never used  Substance and Sexual Activity  . Alcohol use: No  . Drug use: No  . Sexual activity: Not Currently    Birth control/protection: Pill  Other Topics Concern  . Not on file  Social History Narrative  . Not on file   Social Determinants of Health   Financial Resource Strain: Not on file  Food Insecurity: Not on file  Transportation Needs: Not on file  Physical Activity: Not on file  Stress: Not on file  Social Connections: Not on file     Family History: The patient's family history includes Cancer in her mother and sister; Glaucoma in her father; Hyperlipidemia in her father; Hypertension in her father and sister; Lymphoma in her father; Osteoarthritis in her father; Seizures in her sister.  ROS:   Please see the history of present illness.    Review of Systems  Constitutional: Negative for chills, fever and malaise/fatigue.  HENT: Negative for hearing loss.   Eyes: Negative for blurred vision and redness.  Respiratory: Negative for shortness of breath.   Cardiovascular: Negative for chest  pain, palpitations, orthopnea, claudication, leg swelling and PND.  Gastrointestinal: Negative for melena, nausea and vomiting.  Genitourinary: Negative for dysuria.  Musculoskeletal: Negative for falls and myalgias.  Neurological: Negative for dizziness and loss of consciousness.  Endo/Heme/Allergies: Negative for polydipsia.  Psychiatric/Behavioral: Negative for substance abuse.    EKGs/Labs/Other Studies Reviewed:    The following studies were reviewed today: TTE 2020-09-04: IMPRESSIONS  1. Left ventricular ejection fraction, by estimation, is 50 to 55%. The  left ventricle has low normal function. The left ventricle has no regional  wall motion abnormalities. The left ventricular internal cavity size was  mildly dilated. Left ventricular  diastolic parameters are consistent with Grade I diastolic dysfunction  (impaired relaxation).  2. Right ventricular systolic function is normal. The right ventricular  size is normal.  3. The mitral valve is normal in structure. Trivial mitral valve  regurgitation. No evidence of mitral stenosis.  4. The aortic valve is tricuspid. Aortic valve regurgitation is not  visualized. No aortic stenosis is present.  5. The inferior vena cava is normal in size with greater than 50%  respiratory variability, suggesting right atrial pressure of 3 mmHg.   Cardiac MRI 08/21/20: FINDINGS: LEFT VENTRICLE: Prominent apical and lateral trabeculations, meeting criteria for left ventricular noncompaction (2.3:1 ratio of [place]:C myocardium).  Mild-moderately dilated left ventricular chamber size by indexed volume.  Normal left ventricular wall thickness.  Low normal left ventricular ejection fraction with mildly reduced systolic function by indexed volume.  LVEF = 53%  There are no regional wall motion abnormalities.  There is no post contrast delayed myocardial enhancement.  Normal T1 myocardial nulling kinetics suggest against a diagnosis  of cardiac amyloidosis.  ECV - unable to accurately assess with no available hematocrit, however using assumed hematocrit of 40, ECV is approximately 27%  Normal native T2 value 49 msec.  RIGHT VENTRICLE:  Normal right ventricular chamber size.  Normal right ventricular wall thickness.  Normal right ventricular systolic function.  RVEF = 57%  There are no regional wall motion abnormalities.  No post contrast delayed myocardial enhancement.  ATRIA:  Normal left atrial size.  Normal right atrial size.  VALVES:  No significant valvular abnormalities.  PERICARDIUM:  Normal pericardium.  Trivial circumferential pericardial effusion.  OTHER: No significant extracardiac findings.  MEASUREMENTS: Left ventricle:  LV Female  LV EF: 53% (Normal 56-78%)  Absolute volumes:  LV EDV: 261mL (Normal 52-141 mL)  LV ESV: 31mL (Normal 13-51 mL)  LV SV: 112mL (Normal 33-97 mL)  CO: 7.8L/min (Normal 2.7-6.0 L/min)  Indexed volumes:  LV EDV: 34mL/sq-m (Normal 41-81 mL/sq-m)  LV ESV: 60mL/sq-m (Normal 12-21 mL/sq-m)  LV SV: 26mL/sq-m (Normal 26-56 mL/sq-m)  CI: 3.65L/min/sq-m (normal 1.8-3.8 L/min/sq-m)  Right ventricle:  RV female  RV EF: 57% (Normal  47-80%)  Absolute volumes:  RV EDV: 156 mL (Normal 58-154 mL)  RV ESV: 71 mL (Normal 12-68 mL)  RV SV: 93 mL (Normal 35-98 mL)  CO: 6.8 L/min (Normal 2.7-6 L/min)  Indexed volumes:  RV EDV: 77 ML/sq-m (Normal 48-87 mL/sq-m)  RV ESV: 33 mL/sq-m (Normal 11-28 mL/sq-m)  RV SV: 44 mL/sq-m (Normal 27-57 mL/sq-m)  CI: 3.17 L/min/sq-m (Normal 1.8-3.8 L/min/sq-m)  IMPRESSION: 1. Mild-moderately dilated left ventricular chamber size with low normal LVEF, 53%.  2. Prominent LV lateral and apical trabeculations, meets criteria for LV noncompaction with 2.3:1 ratio [place]:C myocardium.  3. No definite post-contrast delayed myocardial or pericardial enhancement. No  myocardial edema. No findings to suggest pericarditis or myocarditis.  EKG:  EKG is  ordered today.  The ekg ordered today demonstrates NSR with HR 83  Recent Labs: 09/20/2020: BUN 12; Creatinine, Ser 0.61; Potassium 4.2; Sodium 140  Recent Lipid Panel    Component Value Date/Time   CHOL 189 08/15/2019 0901   TRIG 120 08/15/2019 0901   HDL 46 08/15/2019 0901   CHOLHDL 4.1 08/15/2019 0901   LDLCALC 121 (H) 08/15/2019 0901     Physical Exam:    VS:  Pulse 83   Ht 5\' 3"  (1.6 m)   Wt 222 lb (100.7 kg)   BMI 39.33 kg/m     Wt Readings from Last 3 Encounters:  02/21/21 222 lb (100.7 kg)  09/06/20 222 lb 6.4 oz (100.9 kg)  08/14/20 224 lb 3.2 oz (101.7 kg)     GEN:  Well nourished, well developed in no acute distress HEENT: Normal NECK: No JVD; No carotid bruits CARDIAC: RRR, no murmurs, rubs, gallops RESPIRATORY:  Clear to auscultation without rales, wheezing or rhonchi  ABDOMEN: Soft, non-tender, non-distended MUSCULOSKELETAL:  No edema; No deformity  SKIN: Warm and dry NEUROLOGIC:  Alert and oriented x 3 PSYCHIATRIC:  Normal affect   ASSESSMENT:    1. Chest pain of uncertain etiology   2. Abnormal echocardiogram   3. Obesity (BMI 30-39.9)    PLAN:    In order of problems listed above:  #LV Non-compaction: LVEF 53% on cardiac MRI with 2.3:1 ration of [place]:C myocardium. No LGE. LVEF 50-55% with no arrhythmias, history of syncope, HF symptoms. No known family history of heart disease, SCD. Has two children who are doing well with no known heart issues.  -Repeat TTE to reassess LVEF -Continue losartan 25mg  daily -No indication for AC at this time as LVEF>40% -Will need annual ECG/TTEs going forward unless clinical change -No exercise restrictions -Can consider genetic counseling/testing as may be a proper way to screen kids as well  #Obesity: BMI 39. Adherent to healthy diet and lifestyle modifications. -Will check into wegovy vs ozempic for patient -Continue  healthy diet and exercise  Exercise recommendations: Goal of exercising for at least 30 minutes a day, at least 5 times per week.  Please exercise to a moderate exertion.  This means that while exercising it is difficult to speak in full sentences, however you are not so short of breath that you feel you must stop, and not so comfortable that you can carry on a full conversation.  Exertion level should be approximately a 5/10, if 10 is the most exertion you can perform.  Diet recommendations: Recommend a heart healthy diet such as the Mediterranean diet.  This diet consists of plant based foods, healthy fats, lean meats, olive oil.  It suggests limiting the intake of simple carbohydrates such as white breads, pastries,  and pastas.  It also limits the amount of red meat, wine, and dairy products such as cheese that one should consume on a daily basis.     Medication Adjustments/Labs and Tests Ordered: Current medicines are reviewed at length with the patient today.  Concerns regarding medicines are outlined above.  Orders Placed This Encounter  Procedures  . EKG 12-Lead  . ECHOCARDIOGRAM COMPLETE   No orders of the defined types were placed in this encounter.   Patient Instructions  Medication Instructions:  NONEIf you need a refill on your cardiac medications before your next appointment, please call your pharmacy*   Lab Work: NONE If you have labs (blood work) drawn today and your tests are completely normal, you will receive your results only by: Marland Kitchen MyChart Message (if you have MyChart) OR . A paper copy in the mail If you have any lab test that is abnormal or we need to change your treatment, we will call you to review the results.   Testing/Procedures: Your physician has requested that you have an echocardiogram. Echocardiography is a painless test that uses sound waves to create images of your heart. It provides your doctor with information about the size and shape of your heart  and how well your heart's chambers and valves are working. This procedure takes approximately one hour. There are no restrictions for this procedure.    Follow-Up: At [facility], you and your health needs are our priority.  As part of our continuing mission to provide you with exceptional heart care, we have created designated Provider Care Teams.  These Care Teams include your primary Cardiologist (physician) and Advanced Practice Providers (APPs -  Physician Assistants and Nurse Practitioners) who all work together to provide you with the care you need, when you need it.  We recommend signing up for the patient portal called "MyChart".  Sign up information is provided on this After Visit Summary.  MyChart is used to connect with patients for Virtual Visits (Telemedicine).  Patients are able to view lab/test results, encounter notes, upcoming appointments, etc.  Non-urgent messages can be sent to your provider as well.   To learn more about what you can do with MyChart, go to [email or web address].    Your next appointment:   3 month(s)  The format for your next appointment:   In Person  Provider:   Gwyndolyn Kaufman, MD   Other Instructions NONE     Signed, Freada Bergeron, MD  02/21/2021 11:43 AM    [place]

## 2021-02-21 ENCOUNTER — Other Ambulatory Visit (HOSPITAL_COMMUNITY): Payer: Self-pay

## 2021-02-21 ENCOUNTER — Ambulatory Visit (INDEPENDENT_AMBULATORY_CARE_PROVIDER_SITE_OTHER): Payer: 59 | Admitting: Cardiology

## 2021-02-21 ENCOUNTER — Telehealth: Payer: Self-pay | Admitting: Pharmacist

## 2021-02-21 ENCOUNTER — Other Ambulatory Visit: Payer: Self-pay

## 2021-02-21 VITALS — HR 83 | Ht 63.0 in | Wt 222.0 lb

## 2021-02-21 DIAGNOSIS — E669 Obesity, unspecified: Secondary | ICD-10-CM | POA: Diagnosis not present

## 2021-02-21 DIAGNOSIS — R931 Abnormal findings on diagnostic imaging of heart and coronary circulation: Secondary | ICD-10-CM

## 2021-02-21 DIAGNOSIS — R079 Chest pain, unspecified: Secondary | ICD-10-CM | POA: Diagnosis not present

## 2021-02-21 MED FILL — Losartan Potassium Tab 25 MG: ORAL | 90 days supply | Qty: 90 | Fill #0 | Status: AC

## 2021-02-21 NOTE — Patient Instructions (Signed)
Medication Instructions:  NONEIf you need a refill on your cardiac medications before your next appointment, please call your pharmacy*   Lab Work: NONE If you have labs (blood work) drawn today and your tests are completely normal, you will receive your results only by: Marland Kitchen MyChart Message (if you have MyChart) OR . A paper copy in the mail If you have any lab test that is abnormal or we need to change your treatment, we will call you to review the results.   Testing/Procedures: Your physician has requested that you have an echocardiogram. Echocardiography is a painless test that uses sound waves to create images of your heart. It provides your doctor with information about the size and shape of your heart and how well your heart's chambers and valves are working. This procedure takes approximately one hour. There are no restrictions for this procedure.    Follow-Up: At [facility], you and your health needs are our priority.  As part of our continuing mission to provide you with exceptional heart care, we have created designated Provider Care Teams.  These Care Teams include your primary Cardiologist (physician) and Advanced Practice Providers (APPs -  Physician Assistants and Nurse Practitioners) who all work together to provide you with the care you need, when you need it.  We recommend signing up for the patient portal called "MyChart".  Sign up information is provided on this After Visit Summary.  MyChart is used to connect with patients for Virtual Visits (Telemedicine).  Patients are able to view lab/test results, encounter notes, upcoming appointments, etc.  Non-urgent messages can be sent to your provider as well.   To learn more about what you can do with MyChart, go to [email or web address].    Your next appointment:   3 month(s)  The format for your next appointment:   In Person  Provider:   Gwyndolyn Kaufman, MD   Other Instructions NONE

## 2021-02-21 NOTE — Telephone Encounter (Signed)
Consulted by Dr Johney Frame to initiate GLP1-RA therapy for weight loss. Will submit prior authorization for [facility]. Weight 222 lbs, height 5'3'', BMI 39.

## 2021-02-25 NOTE — Telephone Encounter (Signed)
Message from plan: The request has been partially denied. The authorization is effective for a maximum of 6 fills from 02/25/2021 to 08/26/2021, as long as the member is enrolled in their current health plan. We were asked to cover a larger amount of the drug listed at the top of this letter than allowed under our prior authorization rule. We denied this request based on our Pharmacy Benefit Formulary Exception Document. Your provider requested Wegovy 0.25mg /0.33mL for weight loss at a quantity of 49mL per 28 days (8 pens per 28 days), but our guideline named ANTI-OBESITY AGENTS allows 57mL per 28 days (4 pens per 28 days). Kathleen Mendez is available as 0.25mg /0.28mL, 0.5mg /0.59mL, 1mg /0.7mL, 1.7mg /0.46mL, 2.4mg /0.52mL pens for dose escalation. Because of this, authorizations for [name] 0.25mg /1.8MC, 0.5mg /0.71mL and 1mg /0.63mL pens have been approved that will allow you to fill 1mL per 28 days (4 pens per 28 days) for 6 months. Additional authorizations for Wegovy 1.7mg /0.33mL and 2.4mg /0.4mL pens have been approved that will allow you to fill 51mL per 28 days (4 pens per 28 days) for 6 months. Please work with your doctor to use the medication within the quantity limits approved or get [place] more information if it will allow [place] to approve this request. Renewal for [name] requires that the patient has achieved or maintained at least 5% weight loss from baseline. A written notification letter will follow with additional details.  Request was submitted for 4 pens per 28 days which is the correct quantity. Prior authorization was reviewed incorrectly as 69mL per 28 days. Kathleen Mendez has been approved for requested amount which is consistent with FDA approved label.  Sent msg to pt to discuss starting Wegovy.

## 2021-03-04 ENCOUNTER — Other Ambulatory Visit (HOSPITAL_COMMUNITY): Payer: Self-pay

## 2021-03-04 MED ORDER — SEMAGLUTIDE-WEIGHT MANAGEMENT 0.5 MG/0.5ML ~~LOC~~ SOAJ
0.5000 mg | SUBCUTANEOUS | 0 refills | Status: DC
  Filled 2021-03-04 – 2021-04-04 (×2): qty 2, 28d supply, fill #0

## 2021-03-04 MED ORDER — SEMAGLUTIDE-WEIGHT MANAGEMENT 1 MG/0.5ML ~~LOC~~ SOAJ
1.0000 mg | SUBCUTANEOUS | 0 refills | Status: DC
  Filled 2021-03-04: qty 2, 28d supply, fill #0

## 2021-03-04 MED ORDER — SEMAGLUTIDE-WEIGHT MANAGEMENT 1.7 MG/0.75ML ~~LOC~~ SOAJ
1.7000 mg | SUBCUTANEOUS | 0 refills | Status: DC
  Filled 2021-03-04: qty 3, 28d supply, fill #0

## 2021-03-04 MED ORDER — SEMAGLUTIDE-WEIGHT MANAGEMENT 0.25 MG/0.5ML ~~LOC~~ SOAJ
0.2500 mg | SUBCUTANEOUS | 0 refills | Status: DC
  Filled 2021-03-04: qty 2, 28d supply, fill #0

## 2021-03-04 MED ORDER — SEMAGLUTIDE-WEIGHT MANAGEMENT 2.4 MG/0.75ML ~~LOC~~ SOAJ
2.4000 mg | SUBCUTANEOUS | 5 refills | Status: DC
  Filled 2021-03-04: qty 3, fill #0
  Filled 2021-05-29: qty 3, 28d supply, fill #0
  Filled 2021-07-10: qty 3, 28d supply, fill #1
  Filled 2021-08-08: qty 3, 28d supply, fill #2
  Filled 2021-09-04 – 2021-09-20 (×2): qty 3, 28d supply, fill #3
  Filled 2021-10-17: qty 3, 28d supply, fill #4
  Filled 2021-11-12: qty 3, 28d supply, fill #5

## 2021-03-04 NOTE — Telephone Encounter (Signed)
Spoke with pt, rx for [facility] sent to [place]. Confirmed copay is $25/month. Pt will let us know what time in the next few weeks works to review [employer] with PharmD in office.

## 2021-03-06 NOTE — Telephone Encounter (Signed)
Discussed Wegovy with pt while in clinic today - abbreviated discussion as below. She plans to pick up [facility] and begin injections this weekend.  Current weight management medications: none  Previously tried meds: phentermine, worked short term but then discontinued therapy and gained weight back  Current meds that may affect weight: levothyroxine  Baseline weight/BMI: 222 lbs / 39kg/m2  Insurance payor: commercial  Labs: Lab Results  Component Value Date   HGBA1C 5.6 08/15/2019    Wt Readings from Last 1 Encounters:  02/21/21 222 lb (100.7 kg)    BP Readings from Last 1 Encounters:  02/14/21 134/80   Pulse Readings from Last 1 Encounters:  02/21/21 83       Component Value Date/Time   CHOL 189 08/15/2019 0901   TRIG 120 08/15/2019 0901   HDL 46 08/15/2019 0901   CHOLHDL 4.1 08/15/2019 0901   LDLCALC 121 (H) 08/15/2019 0901    Past Medical History:  Diagnosis Date  . Abnormal weight gain   . Allergy   . Anemia   . Chest pain   . Chest tightness   . Chronic sinusitis   . Excessive or frequent menstruation   . Excessive or frequent menstruation   . Human papillomavirus in conditions classified elsewhere and of unspecified site   . Hypothyroid   . Hypothyroidism   . Low back pain   . Mastodynia   . Menorrhagia   . Morbid obesity ([place])   . OSA (obstructive sleep apnea)   . Other fatigue   . Other malaise and fatigue   . Pericarditis    COVID VACCINE INDUCED  . Personal history of infectious disease   . Sleep disturbance 05/03/2014  . Streptococcal pharyngitis   . Thyrotoxicosis     Current Outpatient Medications on File Prior to Visit  Medication Sig Dispense Refill  . azelastine (ASTELIN) 0.1 % nasal spray PLACE 2 SPRAYS INTO BOTH NOSTRILS 2 (TWO) TIMES DAILY. 30 mL 5  . Cholecalciferol (VITAMIN D) 50 MCG (2000 UT) tablet     . levothyroxine (SYNTHROID) 50 MCG tablet TAKE 1 TABLET BY MOUTH ONCE DAILY IN THE MORNING ON AN EMPTY STOMACH (Patient taking  differently: Take by mouth daily before breakfast. ON AN EMPTY STOMACH) 90 tablet 0  . Loratadine 10 MG CAPS Take 10 mg by mouth daily.    Marland Kitchen losartan (COZAAR) 25 MG tablet TAKE 1 TABLET (25 MG TOTAL) BY MOUTH DAILY. 90 tablet 2   No current facility-administered medications on file prior to visit.    No Known Allergies   Assessment/Plan:  1. Weight loss - Patient has not met goal of at least 5% of body weight loss with comprehensive lifestyle modifications alone in the past 3-6 months. Pharmacotherapy is appropriate to pursue as augmentation. Will start Wegovy at 0.5m weekly; injection training reviewed today. Pt has no personal or family history of medullary thyroid carcinoma (MTC) or Multiple Endocrine Neoplasia syndrome type 2 (MEN 2).   Advised patient on common side effects including nausea, diarrhea, dyspepsia, decreased appetite, and fatigue. Counseled patient on reducing meal size and how to titrate medication to minimize side effects.

## 2021-03-21 ENCOUNTER — Other Ambulatory Visit (HOSPITAL_COMMUNITY): Payer: 59

## 2021-03-26 ENCOUNTER — Ambulatory Visit (HOSPITAL_COMMUNITY): Payer: 59 | Attending: Cardiology

## 2021-03-26 ENCOUNTER — Other Ambulatory Visit: Payer: Self-pay

## 2021-03-26 ENCOUNTER — Telehealth: Payer: Self-pay | Admitting: *Deleted

## 2021-03-26 ENCOUNTER — Other Ambulatory Visit (HOSPITAL_COMMUNITY): Payer: Self-pay

## 2021-03-26 DIAGNOSIS — R0600 Dyspnea, unspecified: Secondary | ICD-10-CM

## 2021-03-26 DIAGNOSIS — R079 Chest pain, unspecified: Secondary | ICD-10-CM | POA: Diagnosis not present

## 2021-03-26 DIAGNOSIS — R0602 Shortness of breath: Secondary | ICD-10-CM

## 2021-03-26 DIAGNOSIS — Z79899 Other long term (current) drug therapy: Secondary | ICD-10-CM

## 2021-03-26 DIAGNOSIS — R931 Abnormal findings on diagnostic imaging of heart and coronary circulation: Secondary | ICD-10-CM

## 2021-03-26 DIAGNOSIS — R0609 Other forms of dyspnea: Secondary | ICD-10-CM

## 2021-03-26 LAB — ECHOCARDIOGRAM COMPLETE
Area-P 1/2: 5.13 cm2
S' Lateral: 4 cm

## 2021-03-26 MED ORDER — LOSARTAN POTASSIUM 25 MG PO TABS
25.0000 mg | ORAL_TABLET | Freq: Every day | ORAL | 2 refills | Status: DC
  Filled 2021-03-26 – 2021-05-29 (×2): qty 90, 90d supply, fill #0
  Filled 2021-08-26: qty 90, 90d supply, fill #1

## 2021-03-26 NOTE — Telephone Encounter (Signed)
-----   Message from Freada Bergeron, MD sent at 03/26/2021 10:49 AM EDT ----- Patient's echo looks good. Pumping function looks normal 50-55%. No significant valve disease. This is great news!

## 2021-03-26 NOTE — Telephone Encounter (Signed)
The patient has been notified of the result and verbalized understanding.  All questions (if any) were answered. Nuala Alpha, LPN 03/28/8415 60:63 AM   Also sent in refill for losartan to the pts confirmed pharmacy of choice.

## 2021-04-03 ENCOUNTER — Telehealth: Payer: Self-pay | Admitting: Pharmacist

## 2021-04-03 NOTE — Telephone Encounter (Signed)
Touched base with pt regarding Wegovy. She reports tolerating injections well so far and is aware that she'll increase to the 0.5mg  dose for month 2 once she completes her 0.25mg  injections. Will continue to reach out to pt each month as she increases her dose to ensure tolerability.

## 2021-04-04 ENCOUNTER — Other Ambulatory Visit (HOSPITAL_COMMUNITY): Payer: Self-pay

## 2021-04-05 ENCOUNTER — Other Ambulatory Visit (HOSPITAL_COMMUNITY): Payer: Self-pay

## 2021-04-05 MED ORDER — OZEMPIC (0.25 OR 0.5 MG/DOSE) 2 MG/1.5ML ~~LOC~~ SOPN
0.5000 mg | PEN_INJECTOR | SUBCUTANEOUS | 0 refills | Status: DC
  Filled 2021-04-05: qty 1.5, 28d supply, fill #0

## 2021-04-05 NOTE — Addendum Note (Signed)
Addended by: Keyunna Coco E on: 04/05/2021 12:01 PM   Modules accepted: Orders

## 2021-04-05 NOTE — Telephone Encounter (Signed)
Pharmacy unable to order [facility] 0.5mg  dose due to national backorder. Sent in rx for Ozempic 0.5mg  once weekly instead. Pharmacy confirmed they can order/fill this for same copay of $24.99/month. Counseled pt on different injection technique with Ozempic. Will touch base in another month to see which form of semaglutide 1mg  is available at the pharmacy.

## 2021-04-26 ENCOUNTER — Other Ambulatory Visit (HOSPITAL_COMMUNITY): Payer: Self-pay

## 2021-04-26 ENCOUNTER — Telehealth: Payer: Self-pay | Admitting: Pharmacist

## 2021-04-26 MED ORDER — OZEMPIC (1 MG/DOSE) 4 MG/3ML ~~LOC~~ SOPN
1.0000 mg | PEN_INJECTOR | SUBCUTANEOUS | 0 refills | Status: DC
  Filled 2021-04-26: qty 3, 28d supply, fill #0

## 2021-04-26 NOTE — Telephone Encounter (Signed)
Pt reports Ozempic has not been working as well as [employer] - not curbing her appetite as much and also giving her worse GI upset and GERD. Discussed that active ingredient is the same in both meds. She's given 3 of the 0.5mg  doses so far. Called [facility] outpatient pharmacy to see if [facility] 1mg  is in stock, they stated only the 1.7mg  and 2.4mg  doses are. Advised pt that 1mg  dose would also need to be Ozempic brand. She is agreeable to trying this, rx has been sent in. She'll keep me updated on GI symptoms.

## 2021-05-16 ENCOUNTER — Other Ambulatory Visit (HOSPITAL_COMMUNITY): Payer: Self-pay

## 2021-05-16 MED ORDER — WEGOVY 1.7 MG/0.75ML ~~LOC~~ SOAJ
1.7000 mg | SUBCUTANEOUS | 0 refills | Status: DC
  Filled 2021-05-16: qty 3, 28d supply, fill #0

## 2021-05-16 NOTE — Telephone Encounter (Signed)
Received msg from pt: Hi Kathleen Mendez, quick question. Since I have completed 2 doses of ozempic 1 mg, is it possible that I could go ahead and jump to the 1.7 mg dose of wegovy if that is available? I am just concerned as [place] will be headed [place] the weekend that I would actually be set to up-titrate my dose and I would prefer to already have started it so that I have time to adjust since I will be out of town x2 weeks. I feel the same on the 1 mg dose as I did on the 0.5 mg dose.  Rx sent to pharmacy for [facility] 1.7mg , confirmed they do have strength in stock. Pt aware and will provide tolerability update in the next few weeks.

## 2021-05-16 NOTE — Addendum Note (Signed)
Addended by: Liban Guedes E on: 05/16/2021 11:54 AM   Modules accepted: Orders

## 2021-05-21 ENCOUNTER — Telehealth: Payer: Self-pay | Admitting: *Deleted

## 2021-05-21 DIAGNOSIS — E785 Hyperlipidemia, unspecified: Secondary | ICD-10-CM

## 2021-05-21 DIAGNOSIS — E039 Hypothyroidism, unspecified: Secondary | ICD-10-CM

## 2021-05-21 DIAGNOSIS — E7800 Pure hypercholesterolemia, unspecified: Secondary | ICD-10-CM

## 2021-05-21 DIAGNOSIS — Z79899 Other long term (current) drug therapy: Secondary | ICD-10-CM

## 2021-05-21 NOTE — Telephone Encounter (Signed)
Pt was scheduled to see Dr. Johney Frame for 7/19, but per Dr. Johney Frame this office will not be needed, she will need labs instead.  Pt will need to have fasting lipids, BMET, and TSH checked.  Scheduled the pt for labs on tomorrow 7/13.  She is aware to come fasting.  OV appt on 7/19 cancelled.  Pt verbalized understanding and agrees with this plan.

## 2021-05-22 ENCOUNTER — Other Ambulatory Visit: Payer: 59 | Admitting: *Deleted

## 2021-05-22 ENCOUNTER — Other Ambulatory Visit: Payer: Self-pay

## 2021-05-22 DIAGNOSIS — Z79899 Other long term (current) drug therapy: Secondary | ICD-10-CM | POA: Diagnosis not present

## 2021-05-22 DIAGNOSIS — E7800 Pure hypercholesterolemia, unspecified: Secondary | ICD-10-CM | POA: Diagnosis not present

## 2021-05-22 DIAGNOSIS — E785 Hyperlipidemia, unspecified: Secondary | ICD-10-CM

## 2021-05-22 DIAGNOSIS — E039 Hypothyroidism, unspecified: Secondary | ICD-10-CM

## 2021-05-22 DIAGNOSIS — R7989 Other specified abnormal findings of blood chemistry: Secondary | ICD-10-CM | POA: Diagnosis not present

## 2021-05-22 LAB — BASIC METABOLIC PANEL
BUN/Creatinine Ratio: 15 (ref 9–23)
BUN: 10 mg/dL (ref 6–20)
CO2: 25 mmol/L (ref 20–29)
Calcium: 9.4 mg/dL (ref 8.7–10.2)
Chloride: 100 mmol/L (ref 96–106)
Creatinine, Ser: 0.66 mg/dL (ref 0.57–1.00)
Glucose: 81 mg/dL (ref 65–99)
Potassium: 3.9 mmol/L (ref 3.5–5.2)
Sodium: 136 mmol/L (ref 134–144)
eGFR: 115 mL/min/{1.73_m2} (ref >59)

## 2021-05-22 LAB — LIPID PANEL
Chol/HDL Ratio: 4.3 ratio (ref 0.0–4.4)
Cholesterol, Total: 205 mg/dL — ABNORMAL HIGH (ref 100–199)
HDL: 48 mg/dL (ref >39)
LDL Chol Calc (NIH): 132 mg/dL — ABNORMAL HIGH (ref 0–99)
Triglycerides: 137 mg/dL (ref 0–149)
VLDL Cholesterol Cal: 25 mg/dL (ref 5–40)

## 2021-05-22 LAB — TSH: TSH: 8.13 u[IU]/mL — ABNORMAL HIGH (ref 0.450–4.500)

## 2021-05-28 ENCOUNTER — Ambulatory Visit: Payer: 59 | Admitting: Cardiology

## 2021-05-29 ENCOUNTER — Other Ambulatory Visit (HOSPITAL_COMMUNITY): Payer: Self-pay

## 2021-05-29 ENCOUNTER — Telehealth: Payer: Self-pay | Admitting: *Deleted

## 2021-05-29 DIAGNOSIS — E039 Hypothyroidism, unspecified: Secondary | ICD-10-CM

## 2021-05-29 DIAGNOSIS — R0602 Shortness of breath: Secondary | ICD-10-CM

## 2021-05-29 DIAGNOSIS — Z79899 Other long term (current) drug therapy: Secondary | ICD-10-CM

## 2021-05-29 DIAGNOSIS — R0600 Dyspnea, unspecified: Secondary | ICD-10-CM

## 2021-05-29 DIAGNOSIS — R0609 Other forms of dyspnea: Secondary | ICD-10-CM

## 2021-05-29 DIAGNOSIS — R7989 Other specified abnormal findings of blood chemistry: Secondary | ICD-10-CM

## 2021-05-29 MED ORDER — LEVOTHYROXINE SODIUM 25 MCG PO TABS
25.0000 ug | ORAL_TABLET | Freq: Every day | ORAL | 1 refills | Status: DC
  Filled 2021-05-29: qty 90, 90d supply, fill #0
  Filled 2021-08-26: qty 90, 90d supply, fill #1

## 2021-05-29 NOTE — Telephone Encounter (Signed)
-----   Message from Freada Bergeron, MD sent at 05/29/2021  1:15 PM EDT ----- Her thyroid levels are normal despite having high TSH. If she is feeling more fatigued and low energy with the symptoms of hypothyroidism, we can always see how she does on low dose synthroid 68mcg daily. We can then repeat TSH in 3 months to see how we are doing.

## 2021-05-29 NOTE — Telephone Encounter (Signed)
Pt aware of lab results and recommendations per Dr. Johney Frame.  Confirmed the pharmacy of choice with the pt. Scheduled her to come in for repeat TSH in 3 months on 08/29/21. Pt verbalized understanding and agrees with this plan.

## 2021-05-30 LAB — SPECIMEN STATUS REPORT

## 2021-05-30 LAB — T4, FREE: Free T4: 1.1 ng/dL (ref 0.82–1.77)

## 2021-05-30 LAB — T3, FREE: T3, Free: 3.2 pg/mL (ref 2.0–4.4)

## 2021-06-18 ENCOUNTER — Telehealth: Payer: Self-pay | Admitting: Student-PharmD

## 2021-06-18 NOTE — Telephone Encounter (Signed)
Called patient to schedule follow up office visit in person after starting [facility] >3 months ago for weight loss. No answer and unable to leave voicemail.

## 2021-06-25 ENCOUNTER — Telehealth: Payer: Self-pay | Admitting: Pharmacist

## 2021-06-25 DIAGNOSIS — Z79899 Other long term (current) drug therapy: Secondary | ICD-10-CM

## 2021-06-25 NOTE — Telephone Encounter (Signed)
Reached out to pt to follow up with [facility] tolerability. She has given 2 of the 2.'4mg'$  dose so far without issue. Does report some occasional heartburn but not as bad as when she first started on the medication. Thinks she has lost about 15 lbs but has not been focusing on the #s too much. Asked pt to check weight and BP once over the next week and send #s to me which she is agreeable to.

## 2021-07-03 NOTE — Telephone Encounter (Signed)
BP today 119/70, plans to weigh over the weekend.

## 2021-07-10 ENCOUNTER — Other Ambulatory Visit (HOSPITAL_COMMUNITY): Payer: Self-pay

## 2021-07-16 NOTE — Telephone Encounter (Addendum)
Pt reports baseline weight 252, updated weight 234 over the weekend. Tried to clarify baseline weight as it was documented as 222 in Epic at 4/14 visit. She reports home weight on 4/30 before beginning Wegovy was 252.  Pt is tolerating Wegovy 2.'4mg'$  once weekly well. Reports acid reflux symptoms have improved. Will touch base again once she's been on therapy for 6 months.  She will be due to recheck lipids and A1c around the 6 month mark and already has lab appt scheduled 10/20 for her TSH. She is agreeable to come fasting for that lab appt, additional orders placed.

## 2021-07-16 NOTE — Addendum Note (Signed)
Addended by: Cristin Penaflor E on: 07/16/2021 12:18 PM   Modules accepted: Orders

## 2021-08-08 ENCOUNTER — Other Ambulatory Visit (HOSPITAL_COMMUNITY): Payer: Self-pay

## 2021-08-26 ENCOUNTER — Other Ambulatory Visit (HOSPITAL_COMMUNITY): Payer: Self-pay

## 2021-08-29 ENCOUNTER — Other Ambulatory Visit: Payer: Self-pay

## 2021-08-29 ENCOUNTER — Other Ambulatory Visit: Payer: 59

## 2021-08-29 DIAGNOSIS — R0609 Other forms of dyspnea: Secondary | ICD-10-CM

## 2021-08-29 DIAGNOSIS — Z79899 Other long term (current) drug therapy: Secondary | ICD-10-CM | POA: Diagnosis not present

## 2021-08-29 DIAGNOSIS — R7989 Other specified abnormal findings of blood chemistry: Secondary | ICD-10-CM

## 2021-08-29 DIAGNOSIS — E039 Hypothyroidism, unspecified: Secondary | ICD-10-CM | POA: Diagnosis not present

## 2021-08-29 DIAGNOSIS — R0602 Shortness of breath: Secondary | ICD-10-CM

## 2021-08-29 LAB — TSH: TSH: 6.14 u[IU]/mL — ABNORMAL HIGH (ref 0.450–4.500)

## 2021-08-29 LAB — HEMOGLOBIN A1C
Est. average glucose Bld gHb Est-mCnc: 108 mg/dL
Hgb A1c MFr Bld: 5.4 % (ref 4.8–5.6)

## 2021-08-29 LAB — LIPID PANEL
Chol/HDL Ratio: 4.2 ratio (ref 0.0–4.4)
Cholesterol, Total: 191 mg/dL (ref 100–199)
HDL: 46 mg/dL (ref >39)
LDL Chol Calc (NIH): 118 mg/dL — ABNORMAL HIGH (ref 0–99)
Triglycerides: 150 mg/dL — ABNORMAL HIGH (ref 0–149)
VLDL Cholesterol Cal: 27 mg/dL (ref 5–40)

## 2021-09-04 ENCOUNTER — Other Ambulatory Visit (HOSPITAL_COMMUNITY): Payer: Self-pay

## 2021-09-05 NOTE — Telephone Encounter (Signed)
Prior auth for [facility] submitted and approved through 09/04/22.

## 2021-09-20 ENCOUNTER — Other Ambulatory Visit (HOSPITAL_COMMUNITY): Payer: Self-pay

## 2021-10-17 ENCOUNTER — Other Ambulatory Visit (HOSPITAL_COMMUNITY): Payer: Self-pay

## 2021-11-12 ENCOUNTER — Other Ambulatory Visit (HOSPITAL_COMMUNITY): Payer: Self-pay

## 2021-11-13 ENCOUNTER — Other Ambulatory Visit (HOSPITAL_COMMUNITY): Payer: Self-pay

## 2021-11-26 ENCOUNTER — Other Ambulatory Visit (HOSPITAL_COMMUNITY): Payer: Self-pay

## 2021-11-26 ENCOUNTER — Other Ambulatory Visit: Payer: Self-pay | Admitting: *Deleted

## 2021-11-26 DIAGNOSIS — Z79899 Other long term (current) drug therapy: Secondary | ICD-10-CM

## 2021-11-26 DIAGNOSIS — R0609 Other forms of dyspnea: Secondary | ICD-10-CM

## 2021-11-26 DIAGNOSIS — R079 Chest pain, unspecified: Secondary | ICD-10-CM

## 2021-11-26 DIAGNOSIS — R0602 Shortness of breath: Secondary | ICD-10-CM

## 2021-11-26 DIAGNOSIS — R931 Abnormal findings on diagnostic imaging of heart and coronary circulation: Secondary | ICD-10-CM

## 2021-11-26 DIAGNOSIS — E039 Hypothyroidism, unspecified: Secondary | ICD-10-CM

## 2021-11-26 DIAGNOSIS — R7989 Other specified abnormal findings of blood chemistry: Secondary | ICD-10-CM

## 2021-11-26 MED ORDER — LOSARTAN POTASSIUM 25 MG PO TABS
25.0000 mg | ORAL_TABLET | Freq: Every day | ORAL | 3 refills | Status: DC
  Filled 2021-11-26: qty 90, 90d supply, fill #0
  Filled 2022-02-18: qty 90, 90d supply, fill #1
  Filled 2022-05-20: qty 90, 90d supply, fill #2
  Filled 2022-08-18: qty 90, 90d supply, fill #3

## 2021-11-26 MED ORDER — LEVOTHYROXINE SODIUM 25 MCG PO TABS
25.0000 ug | ORAL_TABLET | Freq: Every day | ORAL | 3 refills | Status: DC
  Filled 2021-11-26: qty 90, 90d supply, fill #0
  Filled 2022-02-18: qty 90, 90d supply, fill #1
  Filled 2022-05-20: qty 90, 90d supply, fill #2
  Filled 2022-08-18: qty 90, 90d supply, fill #3

## 2021-11-26 NOTE — Progress Notes (Signed)
Refill for losartan and synthroid sent to the pts confirmed pharmacy of choice.  Pt aware of refills sent.

## 2021-11-27 ENCOUNTER — Telehealth: Payer: Self-pay | Admitting: Pharmacist

## 2021-11-27 ENCOUNTER — Other Ambulatory Visit: Payer: Self-pay | Admitting: Pharmacist

## 2021-11-27 ENCOUNTER — Other Ambulatory Visit (HOSPITAL_COMMUNITY): Payer: Self-pay

## 2021-11-27 MED ORDER — SEMAGLUTIDE-WEIGHT MANAGEMENT 2.4 MG/0.75ML ~~LOC~~ SOAJ
2.4000 mg | SUBCUTANEOUS | 11 refills | Status: DC
  Filled 2021-11-27 – 2021-12-13 (×2): qty 3, 28d supply, fill #0
  Filled 2022-01-24: qty 3, 28d supply, fill #1
  Filled 2022-02-18: qty 3, 28d supply, fill #2
  Filled 2022-03-24: qty 3, 28d supply, fill #3
  Filled 2022-04-23: qty 3, 28d supply, fill #4
  Filled 2022-05-20: qty 3, 28d supply, fill #5
  Filled 2022-06-19: qty 3, 28d supply, fill #6
  Filled 2022-07-25: qty 3, 28d supply, fill #7
  Filled 2022-08-18: qty 3, 28d supply, fill #8
  Filled 2022-09-17: qty 3, 28d supply, fill #9

## 2021-11-27 NOTE — Telephone Encounter (Signed)
Follow up vitals after pt has been on [facility] for ~6 months: BP 117/70 and weight 218 lbs.

## 2021-12-13 ENCOUNTER — Other Ambulatory Visit (HOSPITAL_COMMUNITY): Payer: Self-pay

## 2021-12-16 ENCOUNTER — Other Ambulatory Visit (HOSPITAL_COMMUNITY): Payer: Self-pay

## 2022-01-24 ENCOUNTER — Other Ambulatory Visit (HOSPITAL_COMMUNITY): Payer: Self-pay

## 2022-02-18 ENCOUNTER — Other Ambulatory Visit (HOSPITAL_COMMUNITY): Payer: Self-pay

## 2022-03-24 ENCOUNTER — Other Ambulatory Visit (HOSPITAL_COMMUNITY): Payer: Self-pay

## 2022-04-23 ENCOUNTER — Other Ambulatory Visit (HOSPITAL_COMMUNITY): Payer: Self-pay

## 2022-04-24 ENCOUNTER — Other Ambulatory Visit (HOSPITAL_COMMUNITY): Payer: Self-pay

## 2022-05-20 ENCOUNTER — Other Ambulatory Visit (HOSPITAL_COMMUNITY): Payer: Self-pay

## 2022-06-19 ENCOUNTER — Other Ambulatory Visit (HOSPITAL_COMMUNITY): Payer: Self-pay

## 2022-07-25 ENCOUNTER — Other Ambulatory Visit (HOSPITAL_COMMUNITY): Payer: Self-pay

## 2022-08-18 ENCOUNTER — Other Ambulatory Visit (HOSPITAL_COMMUNITY): Payer: Self-pay

## 2022-08-28 ENCOUNTER — Telehealth: Payer: Self-pay

## 2022-08-28 NOTE — Telephone Encounter (Signed)
Called and informed patient that she was due for an annual visit, she stated that her work schedule is so busy right now since they are short staffed. She was wondering if a televisit would be ok?

## 2022-08-28 NOTE — Telephone Encounter (Signed)
Patient called in  - DOB verified - requesting influenza vaccine exempt letter.  Patient advised message would be forwarded to provider, once provider responds she will be contacted.  Patient verbalized understanding, no further questions.

## 2022-08-29 ENCOUNTER — Encounter: Payer: Self-pay | Admitting: *Deleted

## 2022-08-29 DIAGNOSIS — J309 Allergic rhinitis, unspecified: Secondary | ICD-10-CM

## 2022-08-29 NOTE — Telephone Encounter (Signed)
Patient called back, states she cannot come in before 10-31 (which is when letter is due) patient states she can definitely do a televisit but it would have to be after 10-31. I advised patient Dr. Nelva Bush states she can pay the form/letter completion fee if she cannot make an appointment at this time, that way our office could work on letter.  Patient stated no one advised her of this & that  she would be willing to pay the fee. Patient states she will call later to pay over the phone.

## 2022-08-29 NOTE — Telephone Encounter (Signed)
Called patient - DPR from 2021 verified - LMOVM regarding provider's notation following:   In office visit is always preferable but if she is not able to get in anytime then we can make a televisit work.  Patient advised to contact office to schedule yearly office visit or televisit.

## 2022-08-29 NOTE — Telephone Encounter (Signed)
Patient paid $10.00 letter fee. Patient will come by office to pick up letter. Letter has been placed in [place] side for pick up - patient informed will come by Tuesday to pick up.

## 2022-09-17 ENCOUNTER — Other Ambulatory Visit (HOSPITAL_COMMUNITY): Payer: Self-pay

## 2022-09-19 NOTE — Telephone Encounter (Addendum)
Starting weight was 252, current weight 216 Patient does at least 30-45 min of treadmill/elliptical at least 4 days a week and also weight machines  Renewal PA submitted Key: [id number]

## 2022-09-22 NOTE — Telephone Encounter (Signed)
PA approved through 09/20/23

## 2022-09-25 ENCOUNTER — Ambulatory Visit: Payer: 59 | Admitting: Family Medicine

## 2023-01-05 NOTE — Telephone Encounter (Signed)
Called patient - No current DPR on file - LMOVM advising of notation below.

## 2023-05-20 ENCOUNTER — Other Ambulatory Visit: Payer: Self-pay | Admitting: Oncology

## 2023-05-20 DIAGNOSIS — Z006 Encounter for examination for normal comparison and control in clinical research program: Secondary | ICD-10-CM

## 2023-12-04 ENCOUNTER — Telehealth: Payer: Commercial Managed Care - PPO | Admitting: Family Medicine

## 2023-12-04 ENCOUNTER — Other Ambulatory Visit (HOSPITAL_COMMUNITY): Payer: Self-pay

## 2023-12-04 DIAGNOSIS — J40 Bronchitis, not specified as acute or chronic: Secondary | ICD-10-CM

## 2023-12-04 MED ORDER — AZITHROMYCIN 250 MG PO TABS
ORAL_TABLET | ORAL | 0 refills | Status: AC
  Filled 2023-12-04: qty 6, 5d supply, fill #0

## 2023-12-04 MED ORDER — BENZONATATE 200 MG PO CAPS
200.0000 mg | ORAL_CAPSULE | Freq: Two times a day (BID) | ORAL | 0 refills | Status: DC | PRN
  Filled 2023-12-04: qty 20, 10d supply, fill #0

## 2023-12-04 NOTE — Progress Notes (Signed)

## 2024-03-01 ENCOUNTER — Other Ambulatory Visit (HOSPITAL_COMMUNITY): Payer: Self-pay

## 2024-03-01 ENCOUNTER — Telehealth: Admitting: Physician Assistant

## 2024-03-01 DIAGNOSIS — B9689 Other specified bacterial agents as the cause of diseases classified elsewhere: Secondary | ICD-10-CM | POA: Diagnosis not present

## 2024-03-01 DIAGNOSIS — J019 Acute sinusitis, unspecified: Secondary | ICD-10-CM | POA: Diagnosis not present

## 2024-03-01 MED ORDER — AMOXICILLIN-POT CLAVULANATE 875-125 MG PO TABS
1.0000 | ORAL_TABLET | Freq: Two times a day (BID) | ORAL | 0 refills | Status: DC
  Filled 2024-03-01: qty 14, 7d supply, fill #0

## 2024-03-01 NOTE — Progress Notes (Signed)
 I have spent 5 minutes in review of e-visit questionnaire, review and updating patient chart, medical decision making and response to patient.   Piedad Climes, PA-C

## 2024-03-01 NOTE — Progress Notes (Signed)
 Message sent to patient requesting further input regarding current symptoms. Awaiting patient response.

## 2024-03-01 NOTE — Progress Notes (Signed)

## 2024-03-08 DIAGNOSIS — L820 Inflamed seborrheic keratosis: Secondary | ICD-10-CM | POA: Diagnosis not present

## 2024-03-08 DIAGNOSIS — D239 Other benign neoplasm of skin, unspecified: Secondary | ICD-10-CM | POA: Diagnosis not present

## 2024-08-21 ENCOUNTER — Other Ambulatory Visit: Payer: Self-pay | Admitting: Medical Genetics

## 2024-08-21 DIAGNOSIS — Z006 Encounter for examination for normal comparison and control in clinical research program: Secondary | ICD-10-CM

## 2024-10-13 ENCOUNTER — Ambulatory Visit: Payer: Self-pay | Admitting: Urgent Care

## 2024-10-13 ENCOUNTER — Other Ambulatory Visit (HOSPITAL_COMMUNITY): Payer: Self-pay

## 2024-10-13 ENCOUNTER — Ambulatory Visit
Admission: RE | Admit: 2024-10-13 | Discharge: 2024-10-13 | Disposition: A | Source: Ambulatory Visit | Attending: Urgent Care | Admitting: Urgent Care

## 2024-10-13 ENCOUNTER — Ambulatory Visit: Admitting: Urgent Care

## 2024-10-13 VITALS — BP 152/88 | HR 85 | Ht 63.5 in | Wt 247.0 lb

## 2024-10-13 DIAGNOSIS — R030 Elevated blood-pressure reading, without diagnosis of hypertension: Secondary | ICD-10-CM

## 2024-10-13 DIAGNOSIS — G4733 Obstructive sleep apnea (adult) (pediatric): Secondary | ICD-10-CM | POA: Diagnosis not present

## 2024-10-13 DIAGNOSIS — K8000 Calculus of gallbladder with acute cholecystitis without obstruction: Secondary | ICD-10-CM | POA: Diagnosis not present

## 2024-10-13 DIAGNOSIS — R1011 Right upper quadrant pain: Secondary | ICD-10-CM | POA: Insufficient documentation

## 2024-10-13 DIAGNOSIS — Z8639 Personal history of other endocrine, nutritional and metabolic disease: Secondary | ICD-10-CM | POA: Diagnosis not present

## 2024-10-13 DIAGNOSIS — G4730 Sleep apnea, unspecified: Secondary | ICD-10-CM | POA: Diagnosis not present

## 2024-10-13 DIAGNOSIS — K760 Fatty (change of) liver, not elsewhere classified: Secondary | ICD-10-CM | POA: Diagnosis not present

## 2024-10-13 DIAGNOSIS — K8020 Calculus of gallbladder without cholecystitis without obstruction: Secondary | ICD-10-CM

## 2024-10-13 MED ORDER — ZEPBOUND 2.5 MG/0.5ML ~~LOC~~ SOAJ
2.5000 mg | SUBCUTANEOUS | 0 refills | Status: DC
  Filled 2024-10-13: qty 2, 28d supply, fill #0

## 2024-10-13 NOTE — Patient Instructions (Signed)
 I have prescribed zepbound to help with OSA and weight loss. I have also placed a referral to our clinical pharmacist to aid in affordability of this.  Please avoid sudafed as this can contribute to blood pressure issues.  Please obtain your RUQ US  as soon as possible.   Follow up with me again in two weeks, sooner if needed.

## 2024-10-13 NOTE — Progress Notes (Signed)
 New Patient Office Visit  Subjective:  Patient ID: Kathleen Mendez, female    DOB: 09-26-1981  Age: 43 y.o. MRN: [id number]  CC:  Chief Complaint  Patient presents with   Establish Care    HPI [name] presents to establish care.  Discussed the use of AI scribe software for clinical note transcription with the patient, who gave verbal consent to proceed.  History of Present Illness   Kathleen Mendez is a 43 year old female who presents with right upper quadrant pain.  She has been experiencing right upper quadrant pain for the past few months, initially attributing it to gas pains. Recently, she had two episodes of severe pain in the middle of the night, radiating to her shoulder blades, resembling textbook gallbladder pain. These episodes were accompanied by vomiting, including bile. She has not undergone any recent imaging or laboratory tests to evaluate this pain.  She has a history of sleep apnea but is unable to tolerate the CPAP mask due to phobia. She experiences daytime sleepiness, which has improved recently but was significantly worse about eight months ago.  She is not on any medications for hypertension. She is currently taking allergy medications, rotating between Allegra and Sudafed. She was previously on levothyroxine  for a TSH level of 6.3 three years ago but discontinued it as she felt it did not help with her weight, hair, or fatigue. Her T3 and T4 levels were normal at that time.  She was on Wegovy  for weight loss, which resulted in a 35-pound weight loss before she discontinued it.       Outpatient Encounter Medications as of 10/13/2024  Medication Sig   Loratadine 10 MG CAPS Take 10 mg by mouth daily.   phenylephrine (SUDAFED PE) 10 MG TABS tablet Take 10 mg by mouth every 4 (four) hours as needed.   tirzepatide (ZEPBOUND) 2.5 MG/0.5ML Pen Inject 2.5 mg into the skin once a week.   [DISCONTINUED] amoxicillin -clavulanate (AUGMENTIN ) 875-125 MG tablet Take 1  tablet by mouth 2 (two) times daily.   [DISCONTINUED] azelastine  (ASTELIN ) 0.1 % nasal spray PLACE 2 SPRAYS INTO BOTH NOSTRILS 2 (TWO) TIMES DAILY.   [DISCONTINUED] benzonatate  (TESSALON ) 200 MG capsule Take 1 capsule (200 mg total) by mouth 2 (two) times daily as needed for cough.   [DISCONTINUED] Cholecalciferol (VITAMIN D ) 50 MCG (2000 UT) tablet    [DISCONTINUED] levothyroxine  (SYNTHROID ) 25 MCG tablet Take 1 tablet (25 mcg total) by mouth daily before breakfast.   [DISCONTINUED] losartan  (COZAAR ) 25 MG tablet Take 1 tablet (25 mg total) by mouth daily.   [DISCONTINUED] Semaglutide -Weight Management 2.4 MG/0.75ML SOAJ Inject 2.4 mg into the skin once a week.   No facility-administered encounter medications on file as of 10/13/2024.    Past Medical History:  Diagnosis Date   Abnormal weight gain    Allergy    Anemia    Chest pain    Chest tightness    Chronic sinusitis    Excessive or frequent menstruation    Excessive or frequent menstruation    GERD (gastroesophageal reflux disease)    Human papillomavirus in conditions classified elsewhere and of unspecified site    Hypothyroid    Hypothyroidism    Low back pain    Mastodynia    Menorrhagia    Morbid obesity (HCC)    OSA (obstructive sleep apnea)    Other fatigue    Other malaise and fatigue    Pericarditis    COVID VACCINE INDUCED  Personal history of infectious disease    Sleep disturbance 05/03/2014   Streptococcal pharyngitis    Thyrotoxicosis     Past Surgical History:  Procedure Laterality Date   CESAREAN SECTION     x2    Family History  Problem Relation Age of Onset   Cancer Mother        Lung   Hyperlipidemia Mother    Hypertension Father    Osteoarthritis Father    Hyperlipidemia Father    Glaucoma Father    Lymphoma Father    Cancer Father    Cancer Sister        Lung   Hypertension Sister    Seizures Sister     Social History   Socioeconomic History   Marital status: Single     Spouse name: Not on file   Number of children: Not on file   Years of education: Not on file   Highest education level: Associate degree: occupational, scientist, product/process development, or vocational program  Occupational History   Not on file  Tobacco Use   Smoking status: Never   Smokeless tobacco: Never  Vaping Use   Vaping status: Never Used  Substance and Sexual Activity   Alcohol use: Never   Drug use: Never   Sexual activity: Not Currently    Birth control/protection: Pill  Other Topics Concern   Not on file  Social History Narrative   Not on file   Social Drivers of Health   Financial Resource Strain: Low Risk  (10/13/2024)   Overall Financial Resource Strain (CARDIA)    Difficulty of Paying Living Expenses: Not hard at all  Food Insecurity: No Food Insecurity (10/13/2024)   Hunger Vital Sign    Worried About Running Out of Food in the Last Year: Never true    Ran Out of Food in the Last Year: Never true  Transportation Needs: No Transportation Needs (10/13/2024)   PRAPARE - Administrator, Civil Service (Medical): No    Lack of Transportation (Non-Medical): No  Physical Activity: Insufficiently Active (10/13/2024)   Exercise Vital Sign    Days of Exercise per Week: 4 days    Minutes of Exercise per Session: 30 min  Stress: No Stress Concern Present (10/13/2024)   [employer] of Occupational Health - Occupational Stress Questionnaire    Feeling of Stress: Not at all  Social Connections: Socially Isolated (10/13/2024)   Social Connection and Isolation Panel    Frequency of Communication with Friends and Family: Once a week    Frequency of Social Gatherings with Friends and Family: More than three times a week    Attends Religious Services: Never    Database Administrator or Organizations: No    Attends Engineer, Structural: Not on file    Marital Status: Never married  Intimate Partner Violence: Not on file    ROS: as noted in HPI  Objective:  BP (!) 152/88    Pulse 85   Ht 5' 3.5 (1.613 m)   Wt 247 lb (112 kg)   SpO2 99%   BMI 43.07 kg/m   Physical Exam Vitals and nursing note reviewed. Exam conducted with a chaperone present.  Constitutional:      General: She is not in acute distress.    Appearance: Normal appearance. She is not ill-appearing, toxic-appearing or diaphoretic.  HENT:     Head: Normocephalic and atraumatic.     Right Ear: Tympanic membrane, ear canal and external ear normal. There is  no impacted cerumen.     Left Ear: Tympanic membrane, ear canal and external ear normal. There is no impacted cerumen.     Nose: Nose normal.     Mouth/Throat:     Mouth: Mucous membranes are moist.     Pharynx: Oropharynx is clear. No oropharyngeal exudate or posterior oropharyngeal erythema.  Eyes:     General: No scleral icterus.       Right eye: No discharge.        Left eye: No discharge.     Extraocular Movements: Extraocular movements intact.     Pupils: Pupils are equal, round, and reactive to light.  Neck:     Thyroid : No thyroid  mass, thyromegaly or thyroid  tenderness.  Cardiovascular:     Rate and Rhythm: Normal rate and regular rhythm.     Pulses: Normal pulses.     Heart sounds: No murmur heard. Pulmonary:     Effort: Pulmonary effort is normal. No respiratory distress.     Breath sounds: Normal breath sounds. No stridor. No wheezing or rhonchi.  Abdominal:     General: Abdomen is flat. Bowel sounds are normal. There is no distension.     Palpations: Abdomen is soft. There is no mass.     Tenderness: There is abdominal tenderness in the right upper quadrant. There is no guarding. Positive signs include Murphy's sign.  Musculoskeletal:     Cervical back: Normal range of motion and neck supple. No rigidity or tenderness.     Right lower leg: No edema.     Left lower leg: No edema.  Lymphadenopathy:     Cervical: No cervical adenopathy.  Skin:    General: Skin is warm and dry.     Coloration: Skin is not jaundiced.      Findings: No bruising, erythema or rash.  Neurological:     General: No focal deficit present.     Mental Status: She is alert and oriented to person, place, and time.     Sensory: No sensory deficit.     Motor: No weakness.  Psychiatric:        Mood and Affect: Mood normal.        Behavior: Behavior normal.     Last CBC No results found for: WBC, HGB, HCT, MCV, MCH, RDW, PLT Last metabolic panel Lab Results  Component Value Date   GLUCOSE 81 05/22/2021   NA 136 05/22/2021   K 3.9 05/22/2021   CL 100 05/22/2021   CO2 25 05/22/2021   BUN 10 05/22/2021   CREATININE 0.66 05/22/2021   EGFR 115 05/22/2021   CALCIUM 9.4 05/22/2021   Last lipids Lab Results  Component Value Date   CHOL 191 08/29/2021   HDL 46 08/29/2021   LDLCALC 118 (H) 08/29/2021   TRIG 150 (H) 08/29/2021   CHOLHDL 4.2 08/29/2021   Last hemoglobin A1c Lab Results  Component Value Date   HGBA1C 5.4 08/29/2021   Last thyroid  functions Lab Results  Component Value Date   TSH 6.140 (H) 08/29/2021   FREET4 1.10 05/22/2021   Last vitamin D  Lab Results  Component Value Date   VD25OH 17.3 (L) 08/15/2019   Last vitamin B12 and Folate No results found for: VITAMINB12, FOLATE    Assessment & Plan:  RUQ pain -     CMP14+EGFR -     CBC with Differential/Platelet -     Lipase -     US  ABDOMEN LIMITED RUQ (LIVER/GB); Future  History of thyroid   disorder -     TSH + free T4 -     T3, reverse -     Anti-TPO Ab (RDL)  Elevated blood pressure reading  Sleep apnea, unspecified type -     AMB Referral [facility]BCI Care Management  Morbid obesity (HCC) -     AMB Referral [facility]BCI Care Management  OSA (obstructive sleep apnea) -     Zepbound; Inject 2.5 mg into the skin once a week.  Dispense: 2 mL; Refill: 0 -     AMB Referral [facility]BCI Care Management   Will obtain Stat US  RUQ to r/o acute chole. Pt appears stable with normal VS. Will repeat labs today. Will start zepbound for OSA and  weight loss.   Elevated bp reading - pt reports due to feeling anxious here today No prior hx of HTN  Return in about 2 weeks (around 10/27/2024).   Benton LITTIE Gave, PA

## 2024-10-14 ENCOUNTER — Other Ambulatory Visit (HOSPITAL_COMMUNITY): Payer: Self-pay

## 2024-10-17 ENCOUNTER — Other Ambulatory Visit (HOSPITAL_COMMUNITY): Payer: Self-pay

## 2024-10-17 MED ORDER — URSODIOL 300 MG PO CAPS
300.0000 mg | ORAL_CAPSULE | Freq: Three times a day (TID) | ORAL | 3 refills | Status: AC
  Filled 2024-10-17: qty 90, 30d supply, fill #0
  Filled 2024-11-13: qty 90, 30d supply, fill #1
  Filled 2024-11-15: qty 270, 90d supply, fill #0

## 2024-10-19 ENCOUNTER — Telehealth: Payer: Self-pay | Admitting: *Deleted

## 2024-10-19 NOTE — Progress Notes (Unsigned)
 Care Guide Pharmacy Note  10/19/2024 Name: Kathleen Mendez MRN: [id number] DOB: 1981-07-20  Referred By: Lowella Benton LITTIE, PA Reason for referral: Call Attempt #1 and Complex Care Management (Outreach to schedule referral with pharmacist )   Kathleen Mendez is a 43 y.o. year old female who is a primary care patient of Lowella Benton LITTIE, [place].  Kathleen Mendez was referred to the pharmacist for assistance related to: med assistance   An unsuccessful telephone outreach was attempted today to contact the patient who was referred to the pharmacy team for assistance with medication assistance. Additional attempts will be made to contact the patient.  Thedford Franks, CMA [place]  [facility], [facility] Guide Direct Dial: [phone]  Fax: [phone] Website: [email or web address]

## 2024-10-20 LAB — LIPASE: Lipase: 23 U/L (ref 14–72)

## 2024-10-20 LAB — CMP14+EGFR
ALT: 20 IU/L (ref 0–32)
AST: 24 IU/L (ref 0–40)
Albumin: 4.5 g/dL (ref 3.9–4.9)
Alkaline Phosphatase: 67 IU/L (ref 41–116)
BUN/Creatinine Ratio: 18 (ref 9–23)
BUN: 11 mg/dL (ref 6–24)
Bilirubin Total: 0.3 mg/dL (ref 0.0–1.2)
CO2: 23 mmol/L (ref 20–29)
Calcium: 9.6 mg/dL (ref 8.7–10.2)
Chloride: 101 mmol/L (ref 96–106)
Creatinine, Ser: 0.62 mg/dL (ref 0.57–1.00)
Globulin, Total: 2.9 g/dL (ref 1.5–4.5)
Glucose: 94 mg/dL (ref 70–99)
Potassium: 4.4 mmol/L (ref 3.5–5.2)
Sodium: 139 mmol/L (ref 134–144)
Total Protein: 7.4 g/dL (ref 6.0–8.5)
eGFR: 115 mL/min/1.73 (ref >59)

## 2024-10-20 LAB — CBC WITH DIFFERENTIAL/PLATELET
Basophils Absolute: 0 x10E3/uL (ref 0.0–0.2)
Basos: 0 %
EOS (ABSOLUTE): 0.2 x10E3/uL (ref 0.0–0.4)
Eos: 4 %
Hematocrit: 36 % (ref 34.0–46.6)
Hemoglobin: 12.3 g/dL (ref 11.1–15.9)
Immature Grans (Abs): 0 x10E3/uL (ref 0.0–0.1)
Immature Granulocytes: 0 %
Lymphocytes Absolute: 2 x10E3/uL (ref 0.7–3.1)
Lymphs: 36 %
MCH: 33.4 pg — ABNORMAL HIGH (ref 26.6–33.0)
MCHC: 34.2 g/dL (ref 31.5–35.7)
MCV: 98 fL — ABNORMAL HIGH (ref 79–97)
Monocytes Absolute: 0.4 x10E3/uL (ref 0.1–0.9)
Monocytes: 7 %
Neutrophils Absolute: 2.9 x10E3/uL (ref 1.4–7.0)
Neutrophils: 53 %
Platelets: 331 x10E3/uL (ref 150–450)
RBC: 3.68 x10E6/uL — ABNORMAL LOW (ref 3.77–5.28)
RDW: 13 % (ref 11.7–15.4)
WBC: 5.4 x10E3/uL (ref 3.4–10.8)

## 2024-10-20 LAB — TSH+FREE T4
Free T4: 0.87 ng/dL (ref 0.82–1.77)
TSH: 6.48 u[IU]/mL — ABNORMAL HIGH (ref 0.450–4.500)

## 2024-10-20 LAB — ANTI-TPO AB (RDL): Anti-TPO Ab (RDL): 44.5 [IU]/mL — AB (ref <9.0)

## 2024-10-20 LAB — T3, REVERSE: Reverse T3, Serum: 19.2 ng/dL (ref 9.2–24.1)

## 2024-10-20 NOTE — Progress Notes (Unsigned)
 Care Guide Pharmacy Note  10/20/2024 Name: Kathleen Mendez MRN: [id number] DOB: 10-Aug-1981  Referred By: Lowella Benton LITTIE, PA Reason for referral: Call Attempt #1 and Complex Care Management (Outreach to schedule referral with pharmacist )   Kathleen Mendez is a 43 y.o. year old female who is a primary care patient of Lowella Benton LITTIE, [place].  Kathleen Mendez was referred to the pharmacist for assistance related to: med assistance   A second unsuccessful telephone outreach was attempted today to contact the patient who was referred to the pharmacy team for assistance with medication assistance. Additional attempts will be made to contact the patient.  Thedford Franks, CMA [place]  [facility], [facility] Guide Direct Dial: [phone]  Fax: [phone] Website: [email or web address]

## 2024-10-21 NOTE — Progress Notes (Signed)
 Care Guide Pharmacy Note  10/21/2024 Name: Kathleen Mendez MRN: [id number] DOB: 1981-01-06  Referred By: Lowella Benton LITTIE, PA Reason for referral: Call Attempt #1 and Complex Care Management (Outreach to schedule referral with pharmacist )   [name] is a 43 y.o. year old female who is a primary care patient of Lowella Benton LITTIE, [place].  [name] was referred to the pharmacist for assistance related to: med assistance   A third unsuccessful telephone outreach was attempted today to contact the patient who was referred to the pharmacy team for assistance with medication management. The Population Health team is pleased to engage with this patient at any time in the future upon receipt of referral and should he/she be interested in assistance from the Population Health team.  Thedford Franks, CMA [facility]  [facility], [facility] Guide Direct Dial: [phone]  Fax: [phone] Website: [email or web address]

## 2024-10-24 ENCOUNTER — Other Ambulatory Visit (HOSPITAL_COMMUNITY): Payer: Self-pay

## 2024-10-24 ENCOUNTER — Encounter: Payer: Self-pay | Admitting: Urgent Care

## 2024-10-24 ENCOUNTER — Ambulatory Visit: Admitting: Urgent Care

## 2024-10-24 VITALS — BP 136/77 | HR 68 | Ht 63.5 in | Wt 251.0 lb

## 2024-10-24 DIAGNOSIS — Z1231 Encounter for screening mammogram for malignant neoplasm of breast: Secondary | ICD-10-CM

## 2024-10-24 DIAGNOSIS — R718 Other abnormality of red blood cells: Secondary | ICD-10-CM | POA: Diagnosis not present

## 2024-10-24 DIAGNOSIS — K8020 Calculus of gallbladder without cholecystitis without obstruction: Secondary | ICD-10-CM | POA: Diagnosis not present

## 2024-10-24 DIAGNOSIS — N920 Excessive and frequent menstruation with regular cycle: Secondary | ICD-10-CM | POA: Diagnosis not present

## 2024-10-24 DIAGNOSIS — E063 Autoimmune thyroiditis: Secondary | ICD-10-CM | POA: Diagnosis not present

## 2024-10-24 MED ORDER — LEVOTHYROXINE SODIUM 50 MCG PO TABS
50.0000 ug | ORAL_TABLET | Freq: Every day | ORAL | 3 refills | Status: AC
  Filled 2024-10-24: qty 90, 90d supply, fill #0

## 2024-10-24 NOTE — Patient Instructions (Addendum)
 Thedford Franks, CMA [place]  [facility], [facility] Guide Direct Dial: [phone]  Please call the above number to discuss medication assistance.  Please have your labs drawn in 6 weeks.  Repeat ultrasound of your gallbladder in 3 months.

## 2024-10-24 NOTE — Progress Notes (Unsigned)
° °  Established Patient Office Visit  Subjective:  Patient ID: Kathleen Mendez, female    DOB: 1981/08/20  Age: 43 y.o. MRN: [id number]  Chief Complaint  Patient presents with   Follow-up    HPI  {History (Optional):23778}  ROS: as noted in HPI  Objective:     BP 136/77   Pulse 68   Ht 5' 3.5 (1.613 m)   Wt 251 lb (113.9 kg)   SpO2 100%   BMI 43.77 kg/m  BP Readings from Last 3 Encounters:  10/24/24 136/77  10/13/24 (!) 152/88  02/14/21 134/80   Wt Readings from Last 3 Encounters:  10/24/24 251 lb (113.9 kg)  10/13/24 247 lb (112 kg)  02/21/21 222 lb (100.7 kg)      Physical Exam   No results found for any visits on 10/24/24.  {Labs (Optional):23779}  The ASCVD Risk score (Arnett DK, et al., 2019) failed to calculate for the following reasons:   Cannot find a previous HDL lab   Cannot find a previous total cholesterol lab   * - Cholesterol units were assumed  Assessment & Plan:  There are no diagnoses linked to this encounter.   No follow-ups on file.   Benton LITTIE Gave, PA

## 2024-10-28 NOTE — Progress Notes (Signed)
 Care Guide Pharmacy Note  10/28/2024 Name: Kathleen Mendez MRN: [id number] DOB: 1980/12/18  Referred By: Lowella Benton LITTIE, PA Reason for referral: Call Attempt #1 and Complex Care Management (Outreach to schedule referral with pharmacist )   [name] is a 43 y.o. year old female who is a primary care patient of Lowella Benton LITTIE, [place].  Tyronza L Turrell was referred to the pharmacist for assistance related to: med assistance   Successful contact was made with the patient to discuss pharmacy services including being ready for the pharmacist to call at least 5 minutes before the scheduled appointment time and to have medication bottles and any blood pressure readings ready for review. The patient agreed to meet with the pharmacist via telephone visit on 11/01/2024  Thedford Franks, CMA   [facility], [facility] Guide Direct Dial: [phone]  Fax: [phone] Website: [email or web address]

## 2024-10-31 ENCOUNTER — Ambulatory Visit: Admitting: Urgent Care

## 2024-11-01 ENCOUNTER — Other Ambulatory Visit (INDEPENDENT_AMBULATORY_CARE_PROVIDER_SITE_OTHER): Payer: Self-pay

## 2024-11-01 ENCOUNTER — Ambulatory Visit: Admitting: Podiatry

## 2024-11-01 ENCOUNTER — Encounter (INDEPENDENT_AMBULATORY_CARE_PROVIDER_SITE_OTHER): Payer: Self-pay | Admitting: Urgent Care

## 2024-11-01 ENCOUNTER — Other Ambulatory Visit (HOSPITAL_COMMUNITY): Payer: Self-pay

## 2024-11-01 ENCOUNTER — Ambulatory Visit

## 2024-11-01 DIAGNOSIS — J019 Acute sinusitis, unspecified: Secondary | ICD-10-CM

## 2024-11-01 DIAGNOSIS — M722 Plantar fascial fibromatosis: Secondary | ICD-10-CM

## 2024-11-01 DIAGNOSIS — B9689 Other specified bacterial agents as the cause of diseases classified elsewhere: Secondary | ICD-10-CM | POA: Diagnosis not present

## 2024-11-01 DIAGNOSIS — E66813 Obesity, class 3: Secondary | ICD-10-CM

## 2024-11-01 DIAGNOSIS — Z6841 Body Mass Index (BMI) 40.0 and over, adult: Secondary | ICD-10-CM

## 2024-11-01 MED ORDER — AMOXICILLIN-POT CLAVULANATE 875-125 MG PO TABS
1.0000 | ORAL_TABLET | Freq: Two times a day (BID) | ORAL | 0 refills | Status: AC
  Filled 2024-11-01: qty 20, 10d supply, fill #0

## 2024-11-01 NOTE — Patient Instructions (Addendum)
 For over the counter inserts I like POWERSTEPS, SUPERFEET, AETREX  --  You can also use VOLTAREN  GEL on the heel  --  Plantar Fasciitis (Heel Spur Syndrome) with Rehab The plantar fascia is a fibrous, ligament-like, soft-tissue structure that spans the bottom of the foot. Plantar fasciitis is a condition that causes pain in the foot due to inflammation of the tissue. SYMPTOMS  Pain and tenderness on the underneath side of the foot. Pain that worsens with standing or walking. CAUSES  Plantar fasciitis is caused by irritation and injury to the plantar fascia on the underneath side of the foot. Common mechanisms of injury include: Direct trauma to bottom of the foot. Damage to a small nerve that runs under the foot where the main fascia attaches to the heel bone. Stress placed on the plantar fascia due to bone spurs. RISK INCREASES WITH:  Activities that place stress on the plantar fascia (running, jumping, pivoting, or cutting). Poor strength and flexibility. Improperly fitted shoes. Tight calf muscles. Flat feet. Failure to warm-up properly before activity. Obesity. PREVENTION Warm up and stretch properly before activity. Allow for adequate recovery between workouts. Maintain physical fitness: Strength, flexibility, and endurance. Cardiovascular fitness. Maintain a health body weight. Avoid stress on the plantar fascia. Wear properly fitted shoes, including arch supports for individuals who have flat feet.  PROGNOSIS  If treated properly, then the symptoms of plantar fasciitis usually resolve without surgery. However, occasionally surgery is necessary.  RELATED COMPLICATIONS  Recurrent symptoms that may result in a chronic condition. Problems of the lower back that are caused by compensating for the injury, such as limping. Pain or weakness of the foot during push-off following surgery. Chronic inflammation, scarring, and partial or complete fascia tear, occurring more  often from repeated injections.  TREATMENT  Treatment initially involves the use of ice and medication to help reduce pain and inflammation. The use of strengthening and stretching exercises may help reduce pain with activity, especially stretches of the Achilles tendon. These exercises may be performed at home or with a therapist. Your caregiver may recommend that you use heel cups of arch supports to help reduce stress on the plantar fascia. Occasionally, corticosteroid injections are given to reduce inflammation. If symptoms persist for greater than 6 months despite non-surgical (conservative), then surgery may be recommended.   MEDICATION  If pain medication is necessary, then nonsteroidal anti-inflammatory medications, such as aspirin and ibuprofen, or other minor pain relievers, such as acetaminophen, are often recommended. Do not take pain medication within 7 days before surgery. Prescription pain relievers may be given if deemed necessary by your caregiver. Use only as directed and only as much as you need. Corticosteroid injections may be given by your caregiver. These injections should be reserved for the most serious cases, because they may only be given a certain number of times.  HEAT AND COLD Cold treatment (icing) relieves pain and reduces inflammation. Cold treatment should be applied for 10 to 15 minutes every 2 to 3 hours for inflammation and pain and immediately after any activity that aggravates your symptoms. Use ice packs or massage the area with a piece of ice (ice massage). Heat treatment may be used prior to performing the stretching and strengthening activities prescribed by your caregiver, physical therapist, or athletic trainer. Use a heat pack or soak the injury in warm water.  SEEK IMMEDIATE MEDICAL CARE IF: Treatment seems to offer no benefit, or the condition worsens. Any medications produce adverse side effects.  EXERCISES- RANGE  OF MOTION (ROM) AND STRETCHING  EXERCISES - Plantar Fasciitis (Heel Spur Syndrome) These exercises may help you when beginning to rehabilitate your injury. Your symptoms may resolve with or without further involvement from your physician, physical therapist or athletic trainer. While completing these exercises, remember:  Restoring tissue flexibility helps normal motion to return to the joints. This allows healthier, less painful movement and activity. An effective stretch should be held for at least 30 seconds. A stretch should never be painful. You should only feel a gentle lengthening or release in the stretched tissue.  RANGE OF MOTION - Toe Extension, Flexion Sit with your right / left leg crossed over your opposite knee. Grasp your toes and gently pull them back toward the top of your foot. You should feel a stretch on the bottom of your toes and/or foot. Hold this stretch for 10 seconds. Now, gently pull your toes toward the bottom of your foot. You should feel a stretch on the top of your toes and or foot. Hold this stretch for 10 seconds. Repeat  times. Complete this stretch 3 times per day.   RANGE OF MOTION - Ankle Dorsiflexion, Active Assisted Remove shoes and sit on a chair that is preferably not on a carpeted surface. Place right / left foot under knee. Extend your opposite leg for support. Keeping your heel down, slide your right / left foot back toward the chair until you feel a stretch at your ankle or calf. If you do not feel a stretch, slide your bottom forward to the edge of the chair, while still keeping your heel down. Hold this stretch for 10 seconds. Repeat 3 times. Complete this stretch 2 times per day.   STRETCH  Gastroc, Standing Place hands on wall. Extend right / left leg, keeping the front knee somewhat bent. Slightly point your toes inward on your back foot. Keeping your right / left heel on the floor and your knee straight, shift your weight toward the wall, not allowing your back to  arch. You should feel a gentle stretch in the right / left calf. Hold this position for 10 seconds. Repeat 3 times. Complete this stretch 2 times per day.  STRETCH  Soleus, Standing Place hands on wall. Extend right / left leg, keeping the other knee somewhat bent. Slightly point your toes inward on your back foot. Keep your right / left heel on the floor, bend your back knee, and slightly shift your weight over the back leg so that you feel a gentle stretch deep in your back calf. Hold this position for 10 seconds. Repeat 3 times. Complete this stretch 2 times per day.  STRETCH  Gastrocsoleus, Standing  Note: This exercise can place a lot of stress on your foot and ankle. Please complete this exercise only if specifically instructed by your caregiver.  Place the ball of your right / left foot on a step, keeping your other foot firmly on the same step. Hold on to the wall or a rail for balance. Slowly lift your other foot, allowing your body weight to press your heel down over the edge of the step. You should feel a stretch in your right / left calf. Hold this position for 10 seconds. Repeat this exercise with a slight bend in your right / left knee. Repeat 3 times. Complete this stretch 2 times per day.   STRENGTHENING EXERCISES - Plantar Fasciitis (Heel Spur Syndrome)  These exercises may help you when beginning to rehabilitate your injury. They  may resolve your symptoms with or without further involvement from your physician, physical therapist or athletic trainer. While completing these exercises, remember:  Muscles can gain both the endurance and the strength needed for everyday activities through controlled exercises. Complete these exercises as instructed by your physician, physical therapist or athletic trainer. Progress the resistance and repetitions only as guided.  STRENGTH - Towel Curls Sit in a chair positioned on a non-carpeted surface. Place your foot on a towel, keeping  your heel on the floor. Pull the towel toward your heel by only curling your toes. Keep your heel on the floor. Repeat 3 times. Complete this exercise 2 times per day.  STRENGTH - Ankle Inversion Secure one end of a rubber exercise band/tubing to a fixed object (table, pole). Loop the other end around your foot just before your toes. Place your fists between your knees. This will focus your strengthening at your ankle. Slowly, pull your big toe up and in, making sure the band/tubing is positioned to resist the entire motion. Hold this position for 10 seconds. Have your muscles resist the band/tubing as it slowly pulls your foot back to the starting position. Repeat 3 times. Complete this exercises 2 times per day.  Document Released: 10/27/2005 Document Revised: 01/19/2012 Document Reviewed: 02/08/2009 [facility] Patient Information 2014 [place], [place].

## 2024-11-01 NOTE — Progress Notes (Signed)
" ° °  11/01/2024 Name: Kathleen Mendez MRN: [id number] DOB: [birth year]  Chief Complaint  Patient presents with   Medication Assistance   Kathleen Mendez is a 43 y.o. year old female who presented for a telephone visit.   They were referred to the pharmacist by their PCP for assistance in managing medication access.    Subjective:  Care Team: Primary Care Provider: Lowella Benton LITTIE, [place] ; Next Scheduled Visit: 01/24/24  Medication Access/Adherence  Current Pharmacy:  [facility] [address], [address] [place] [address] Phone: [phone] Fax: [phone]  Patient reports affordability concerns with their medications: Yes  Patient reports access/transportation concerns to their pharmacy: No  Patient reports adherence concerns with their medications:  Yes    Obesity/Overweight Current medications: none -Weight Management treatments previously prescribed: Wegovy , phentermine  -Patient was on Wegovy  approximately 2 years ago and experienced approximately 25lb weight loss -States she was also prescribed phentermine  in the past by her OBGYN, and this medication provided benefit; but the provider did not want long-term use -Recently prescribed Zepbound  by PCP, but she has not been able to pick up or initiate due to cost -Patient has moderate OSA diagnosis from 2015 sleep study  Objective:  Lab Results  Component Value Date   CREATININE 0.62 10/13/2024   BUN 11 10/13/2024   NA 139 10/13/2024   K 4.4 10/13/2024   CL 101 10/13/2024   CO2 23 10/13/2024   Medications Reviewed Today     Reviewed by Deanna Channing LABOR, RPH (Pharmacist) on 11/01/24 at 1110  Med List Status: <None>   Medication Order Taking? Sig Documenting Provider Last Dose Status Informant  levothyroxine  (SYNTHROID ) 50 MCG tablet [id number] Yes Take 1 tablet (50 mcg total) by mouth daily before breakfast. Lowella, Whitney L, PA  Active   Loratadine 10 MG CAPS [id number]  Take 10 mg  by mouth daily. [provider]  Active   phenylephrine (SUDAFED PE) 10 MG TABS tablet [id number]  Take 10 mg by mouth every 4 (four) hours as needed. [provider]  Active    Patient not taking:   Discontinued 11/01/24 1109 (Cost of medication)   ursodiol  (ACTIGALL ) 300 MG capsule [id number] Yes Take 1 capsule (300 mg total) by mouth 3 (three) times daily. Take with food. Lowella Benton L, PA  Active            Assessment/Plan:   Obesity/Overweight: -[employer] Employee Plan will not cover Zepbound  or Wegovy  even in patient's with established ASCVD or moderate-severe OSA -Informed patient that Zepbound  can be purchased direct through [employer] for $299-$549/month depending on dose, but this is not affordable -Patient is interested in trying phentermine  again and is open to a referral to [facility] in [place] if PCP is not comfortable prescribing/managing.  If pharmacotherapy is initiated, I recommend close monitoring of blood pressure, as patient has had some elevated readings in the past.    Channing LABOR Deanna, PharmD, DPLA    "

## 2024-11-01 NOTE — Progress Notes (Signed)
 Patient scanned today during her visit with Dr. Gershon.    Patient will benefit from custom foot orthotics to provide total contact to bilateral medial longitudinal arches to help balance and distribute body weight more evenly.  Thus reducing plantar pressure and pain.   Orthotic will encourage forefoot and rearfoot alignment.    Patient was scanned today with OHI scanner.    Orthotics are NOT ordered-  she is checking with her insurance for coverage.  She will call if she would like me to proceed with ordering the device.   Paper diepensed for notification of pricing/ fees for the device.  -  Subjective: Chief Complaint  Patient presents with   Plantar Fasciitis    Patient presents today c/o plantar fasciitis of the L foot relates it has been ongoing for a few months patient relates she has tried at home exercise with no results   43 year old female presents the [place] above concerns.  Originally she states that she went to get a steroid injection but she would like to hold off on that now.  She is interested in new inserts possibly.  She states this is her third flareup of plantar fasciitis.  She does not recall any recent injuries when this started.  No numbness or tingling or any radiating pain.  Objective: AAO x3, NAD DP/PT pulses palpable bilaterally, CRT less than 3 seconds Tenderness to palpation along the plantar medial tubercle of the calcaneus at the insertion of plantar fascia on the left foot. There is no pain along the course of the plantar fascia within the arch of the foot. Plantar fascia appears to be intact. There is no pain with lateral compression of the calcaneus or pain with vibratory sensation. There is no pain along the course or insertion of the achilles tendon.  Negative tinel sign.  No other areas of tenderness to bilateral lower extremities. No pain with calf compression, swelling, warmth, erythema  Assessment: Left heel pain, plantar fasciitis  Plan: -All  treatment options discussed with the patient including all alternatives, risks, complications.  -X-rays obtained reviewed.  Multiple views obtained.  No evidence of acute fracture identified at this time. -Discussed over injection patient was to hold off on this.  Discussed Voltaren  gel.  She was using over-the-counter anti-inflammatories, Tylenol but she was getting bleeding, bruising issues.  Oral and oral anti-inflammatories.  Consider oral steroids if needed. -Discussed stretching, icing on regular basis.  Discussed night splint.  She was measured for custom orthotics today but she will check insurance before ordering.  Discussed over-the-counter arch supports including power steps, Superfeet or aetrex.  -Patient encouraged to call the office with any questions, concerns, change in symptoms.    No follow-ups on file.  Donnice JONELLE Gershon DPM

## 2024-11-01 NOTE — Telephone Encounter (Signed)

## 2024-11-02 ENCOUNTER — Other Ambulatory Visit (HOSPITAL_COMMUNITY): Payer: Self-pay

## 2024-11-02 MED ORDER — PHENTERMINE HCL 37.5 MG PO TABS
18.7500 mg | ORAL_TABLET | Freq: Every morning | ORAL | 0 refills | Status: AC
  Filled 2024-11-02 – 2024-11-14 (×2): qty 30, 60d supply, fill #0

## 2024-11-14 ENCOUNTER — Other Ambulatory Visit (HOSPITAL_COMMUNITY): Payer: Self-pay

## 2024-11-15 ENCOUNTER — Other Ambulatory Visit (HOSPITAL_COMMUNITY): Payer: Self-pay

## 2024-11-16 ENCOUNTER — Other Ambulatory Visit: Payer: Self-pay

## 2024-11-23 ENCOUNTER — Ambulatory Visit: Admitting: Obstetrics and Gynecology

## 2024-11-23 ENCOUNTER — Encounter: Payer: Self-pay | Admitting: Obstetrics and Gynecology

## 2024-11-23 ENCOUNTER — Other Ambulatory Visit (HOSPITAL_COMMUNITY): Payer: Self-pay

## 2024-11-23 ENCOUNTER — Other Ambulatory Visit (HOSPITAL_COMMUNITY)
Admission: RE | Admit: 2024-11-23 | Discharge: 2024-11-23 | Disposition: A | Source: Ambulatory Visit | Attending: Obstetrics and Gynecology | Admitting: Obstetrics and Gynecology

## 2024-11-23 ENCOUNTER — Other Ambulatory Visit: Payer: Self-pay

## 2024-11-23 VITALS — BP 133/79 | HR 92 | Ht 64.0 in | Wt 244.0 lb

## 2024-11-23 DIAGNOSIS — Z124 Encounter for screening for malignant neoplasm of cervix: Secondary | ICD-10-CM | POA: Insufficient documentation

## 2024-11-23 DIAGNOSIS — N939 Abnormal uterine and vaginal bleeding, unspecified: Secondary | ICD-10-CM | POA: Diagnosis not present

## 2024-11-23 MED ORDER — TRANEXAMIC ACID 650 MG PO TABS
1300.0000 mg | ORAL_TABLET | Freq: Three times a day (TID) | ORAL | 2 refills | Status: AC
  Filled 2024-11-23: qty 30, 5d supply, fill #0

## 2024-11-23 NOTE — Addendum Note (Signed)
 Addended by: ORLINDA SILVANO ORN on: 11/23/2024 01:17 PM   Modules accepted: Orders

## 2024-11-23 NOTE — Progress Notes (Signed)
 "  GYNECOLOGY OFFICE NOTE  History:  44 y.o. [id number] here today for abnormal uterine bleeding. Has always had heavy monthly periods, lasting 7-14 days, very heavy and will bleed through clothes. Improved on OCPs many years ago. Improved after she had children and has not had any management the last few years. She is here to discuss management of periods as she is tired of them being so heavy and it is disrupting her life.     Past Medical History:  Diagnosis Date   Abnormal weight gain    Allergy    Anemia    Chest pain    Chest tightness    Chronic sinusitis    Excessive or frequent menstruation    Excessive or frequent menstruation    GERD (gastroesophageal reflux disease)    Human papillomavirus in conditions classified elsewhere and of unspecified site    Hypothyroid    Hypothyroidism    Low back pain    Mastodynia    Menorrhagia    Morbid obesity (HCC)    OSA (obstructive sleep apnea)    Other fatigue    Other malaise and fatigue    Pericarditis    COVID VACCINE INDUCED   Personal history of infectious disease    Sleep disturbance 05/03/2014   Streptococcal pharyngitis    Thyrotoxicosis     Past Surgical History:  Procedure Laterality Date   CESAREAN SECTION     x2    Current Medications[1]  The following portions of the patient's history were reviewed and updated as appropriate: allergies, current medications, past family history, past medical history, past social history, past surgical history and problem list.   Review of Systems:  Pertinent items noted in HPI and remainder of comprehensive ROS otherwise negative.   Objective:  Physical Exam BP 133/79   Pulse 92   Ht 5' 4 (1.626 m)   Wt 244 lb (110.7 kg)   LMP 11/08/2024 (Approximate)   BMI 41.88 kg/m  CONSTITUTIONAL: Well-developed, well-nourished female in no acute distress.  HENT:  Normocephalic, atraumatic. External right and left ear normal. Oropharynx is clear and moist EYES: Conjunctivae and  EOM are normal. Pupils are equal, round, and reactive to light. No scleral icterus.  NECK: Normal range of motion, supple, no masses SKIN: Skin is warm and dry. No rash noted. Not diaphoretic. No erythema. No pallor. NEUROLOGIC: Alert and oriented to person, place, and time. Normal reflexes, muscle tone coordination. No cranial nerve deficit noted. PSYCHIATRIC: Normal mood and affect. Normal behavior. Normal judgment and thought content. CARDIOVASCULAR: Normal heart rate noted RESPIRATORY: Effort normal, no problems with respiration noted ABDOMEN: Soft, no distention noted.   PELVIC: external female genitalia with obliteration of labia minora and majora; normal appearing vaginal mucosa and cervix.  No abnormal discharge noted.  Pap smear obtained.  Normal uterine size, no other palpable masses, no uterine or adnexal tenderness. MUSCULOSKELETAL: Normal range of motion. No edema noted.  Exam done with chaperone present.  Labs and Imaging DG Foot Complete Right Result Date: 11/01/2024 Please see detailed radiograph report in office note.  DG Foot Complete Left Result Date: 11/01/2024 Please see detailed radiograph report in office note.   Assessment & Plan:  1. 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 Cervical cancer screening Pap done today   Routine preventative health maintenance measures emphasized. Please refer to After Visit Summary for other counseling  recommendations.   Return in about 3 months (around 02/21/2025) for Followup.   LOIS Yolanda Moats, MD, [facility] Attending Center for [facility])       [1]  Current Outpatient Medications:    levothyroxine  (SYNTHROID ) 50 MCG tablet, Take 1 tablet (50 mcg total) by mouth daily before breakfast., Disp: 90 tablet, Rfl: 3   Loratadine 10 MG CAPS, Take 10 mg by mouth daily., Disp: , Rfl:    phentermine  (ADIPEX-P ) 37.5 MG tablet, Take 1/2 tablet (18.75 mg total) by mouth in the morning., Disp: 30 tablet, Rfl: 0   phenylephrine (SUDAFED PE) 10 MG TABS tablet, Take 10 mg by mouth every 4 (four) hours as needed., Disp: , Rfl:    tranexamic acid  (LYSTEDA ) 650 MG TABS tablet, Take 2 tablets (1,300 mg total) by mouth 3 (three) times daily. Take during menses for a maximum of five days, Disp: 30 tablet, Rfl: 2   ursodiol  (ACTIGALL ) 300 MG capsule, Take 1 capsule (300 mg total) by mouth 3 (three) times daily. Take with food., Disp: 90 capsule, Rfl: 3   amoxicillin -clavulanate (AUGMENTIN ) 875-125 MG tablet, Take 1 tablet by mouth 2 (two) times daily with a meal for 10 days. (Patient not taking: Reported on 11/23/2024), Disp: 20 tablet, Rfl: 0  "

## 2024-11-25 LAB — CYTOLOGY - PAP
Adequacy: ABSENT
Comment: NEGATIVE
Diagnosis: NEGATIVE
High risk HPV: NEGATIVE

## 2024-11-28 ENCOUNTER — Ambulatory Visit: Payer: Self-pay | Admitting: Obstetrics and Gynecology

## 2024-11-29 ENCOUNTER — Other Ambulatory Visit (HOSPITAL_COMMUNITY): Payer: Self-pay

## 2024-11-29 ENCOUNTER — Other Ambulatory Visit: Payer: Self-pay | Admitting: Obstetrics and Gynecology

## 2024-11-29 ENCOUNTER — Other Ambulatory Visit: Payer: Self-pay

## 2024-11-29 ENCOUNTER — Encounter: Payer: Self-pay | Admitting: Pharmacy Technician

## 2024-11-29 DIAGNOSIS — B9689 Other specified bacterial agents as the cause of diseases classified elsewhere: Secondary | ICD-10-CM

## 2024-11-29 MED ORDER — METRONIDAZOLE 500 MG PO TABS
500.0000 mg | ORAL_TABLET | Freq: Two times a day (BID) | ORAL | 0 refills | Status: AC
  Filled 2024-11-29: qty 14, 7d supply, fill #0

## 2024-11-29 NOTE — Progress Notes (Signed)
 Flagyl  sent to pharmayc

## 2024-12-01 ENCOUNTER — Encounter: Payer: Self-pay | Admitting: Urgent Care

## 2024-12-07 ENCOUNTER — Ambulatory Visit: Admitting: Urgent Care

## 2025-01-23 ENCOUNTER — Ambulatory Visit: Admitting: Urgent Care
